# Patient Record
Sex: Male | Born: 1950 | Race: White | Hispanic: No | Marital: Single | Smoking: Current every day smoker
Health system: Southern US, Community
[De-identification: ages and names within clinical notes are randomized; demographics above are authoritative.]

## PROBLEM LIST (undated history)

## (undated) DIAGNOSIS — I96 Gangrene, not elsewhere classified: Secondary | ICD-10-CM

## (undated) DIAGNOSIS — S2249XA Multiple fractures of ribs, unspecified side, initial encounter for closed fracture: Secondary | ICD-10-CM

## (undated) DIAGNOSIS — I509 Heart failure, unspecified: Secondary | ICD-10-CM

## (undated) DIAGNOSIS — J40 Bronchitis, not specified as acute or chronic: Secondary | ICD-10-CM

## (undated) DIAGNOSIS — I739 Peripheral vascular disease, unspecified: Secondary | ICD-10-CM

## (undated) DIAGNOSIS — S329XXA Fracture of unspecified parts of lumbosacral spine and pelvis, initial encounter for closed fracture: Secondary | ICD-10-CM

## (undated) DIAGNOSIS — F419 Anxiety disorder, unspecified: Secondary | ICD-10-CM

## (undated) DIAGNOSIS — M549 Dorsalgia, unspecified: Secondary | ICD-10-CM

## (undated) DIAGNOSIS — J449 Chronic obstructive pulmonary disease, unspecified: Secondary | ICD-10-CM

## (undated) DIAGNOSIS — S2239XA Fracture of one rib, unspecified side, initial encounter for closed fracture: Secondary | ICD-10-CM

## (undated) DIAGNOSIS — M1990 Unspecified osteoarthritis, unspecified site: Secondary | ICD-10-CM

## (undated) DIAGNOSIS — F410 Panic disorder [episodic paroxysmal anxiety] without agoraphobia: Secondary | ICD-10-CM

## (undated) DIAGNOSIS — Z0000 Encounter for general adult medical examination without abnormal findings: Secondary | ICD-10-CM

## (undated) DIAGNOSIS — Z765 Malingerer [conscious simulation]: Secondary | ICD-10-CM

## (undated) HISTORY — DX: Encounter for general adult medical examination without abnormal findings: Z00.00

## (undated) HISTORY — DX: Chronic obstructive pulmonary disease, unspecified: J44.9

## (undated) HISTORY — DX: Heart failure, unspecified: I50.9

## (undated) HISTORY — DX: Gangrene, not elsewhere classified: I96

## (undated) HISTORY — DX: Peripheral vascular disease, unspecified: I73.9

## (undated) HISTORY — PX: TONSILLECTOMY: SUR1361

## (undated) HISTORY — DX: Bronchitis, not specified as acute or chronic: J40

## (undated) HISTORY — DX: Unspecified osteoarthritis, unspecified site: M19.90

## (undated) HISTORY — DX: Panic disorder (episodic paroxysmal anxiety): F41.0

## (undated) HISTORY — PX: HIP SURGERY: SHX245

---

## 2004-05-03 ENCOUNTER — Ambulatory Visit: Payer: Self-pay | Admitting: Internal Medicine

## 2004-05-17 ENCOUNTER — Ambulatory Visit: Payer: Self-pay | Admitting: Family Medicine

## 2004-05-18 ENCOUNTER — Ambulatory Visit: Payer: Self-pay | Admitting: *Deleted

## 2005-08-29 ENCOUNTER — Ambulatory Visit: Payer: Self-pay | Admitting: Oncology

## 2005-09-07 LAB — CBC WITH DIFFERENTIAL/PLATELET
Basophils Absolute: 0 10*3/uL (ref 0.0–0.1)
MCH: 34.4 pg — ABNORMAL HIGH (ref 28.0–33.4)
MCHC: 35 g/dL (ref 32.0–35.9)
MCV: 98.4 fL — ABNORMAL HIGH (ref 81.6–98.0)
MONO#: 0.6 10*3/uL (ref 0.1–0.9)
Platelets: 298 10*3/uL (ref 145–400)
RBC: 4.69 10*6/uL (ref 4.20–5.71)
RDW: 13.9 % (ref 11.2–14.6)
lymph#: 2.7 10*3/uL (ref 0.9–3.3)

## 2005-09-07 LAB — IVY BLEEDING TIME: Bleeding Time: 2.5 Minutes (ref 2.0–8.0)

## 2005-09-11 LAB — APTT: aPTT: 28 seconds (ref 24–37)

## 2005-09-11 LAB — COMPREHENSIVE METABOLIC PANEL
BUN: 8 mg/dL (ref 6–23)
CO2: 28 mEq/L (ref 19–32)
Calcium: 9.2 mg/dL (ref 8.4–10.5)
Glucose, Bld: 91 mg/dL (ref 70–99)
Potassium: 4.5 mEq/L (ref 3.5–5.3)
Sodium: 136 mEq/L (ref 135–145)

## 2005-09-11 LAB — FIBRINOGEN: Fibrinogen: 348 mg/dL (ref 204–475)

## 2005-09-11 LAB — PROTHROMBIN TIME: INR: 1 (ref 0.0–1.5)

## 2010-01-17 ENCOUNTER — Emergency Department (HOSPITAL_COMMUNITY)
Admission: EM | Admit: 2010-01-17 | Discharge: 2010-01-17 | Payer: Self-pay | Source: Home / Self Care | Admitting: Emergency Medicine

## 2011-02-12 ENCOUNTER — Emergency Department (HOSPITAL_COMMUNITY)
Admission: EM | Admit: 2011-02-12 | Discharge: 2011-02-13 | Disposition: A | Discharge status: Home / Self Care | Payer: Medicaid Other | Attending: Emergency Medicine | Admitting: Emergency Medicine

## 2011-02-12 ENCOUNTER — Emergency Department (HOSPITAL_COMMUNITY): Payer: Medicaid Other

## 2011-02-12 DIAGNOSIS — S99929A Unspecified injury of unspecified foot, initial encounter: Secondary | ICD-10-CM

## 2011-02-12 DIAGNOSIS — IMO0002 Reserved for concepts with insufficient information to code with codable children: Secondary | ICD-10-CM | POA: Insufficient documentation

## 2011-02-12 DIAGNOSIS — Y92009 Unspecified place in unspecified non-institutional (private) residence as the place of occurrence of the external cause: Secondary | ICD-10-CM | POA: Insufficient documentation

## 2011-02-12 DIAGNOSIS — M79609 Pain in unspecified limb: Secondary | ICD-10-CM | POA: Insufficient documentation

## 2011-02-12 DIAGNOSIS — S8990XA Unspecified injury of unspecified lower leg, initial encounter: Secondary | ICD-10-CM | POA: Insufficient documentation

## 2011-02-12 HISTORY — DX: Anxiety disorder, unspecified: F41.9

## 2011-02-12 NOTE — ED Notes (Signed)
Pt reports injuring left foot three weeks ago - reports increased pain- swelling and bruising to left foot

## 2011-02-13 MED ORDER — OXYCODONE-ACETAMINOPHEN 5-325 MG PO TABS: 1.0000 | ORAL_TABLET | ORAL | Status: AC | PRN

## 2011-02-13 NOTE — ED Provider Notes (Signed)
History     CSN: [id number]  Arrival date & time 02/12/11  2041   First MD Initiated Contact with Patient 02/13/11 0001      Chief Complaint  Patient presents with  . Foot Pain    Patient is a 61 y.o. male presenting with lower extremity pain. The history is provided by the patient.  Foot Pain This is a new problem. The current episode started more than 1 week ago. The problem occurs constantly. The problem has been gradually worsening. Pertinent negatives include no headaches. The symptoms are aggravated by walking. The symptoms are relieved by rest.  Pt reports that three weeks ago he accidentally kicked his door with both feet The pain has worsened in his left foot He denies burn exposure He denies cold/wet exposure to his foot He denies weakness/numbness to his feet  Past Medical History  Diagnosis Date  . Anxiety     History reviewed. No pertinent past surgical history.  No family history on file.  History  Substance Use Topics  . Smoking status: Current Everyday Smoker  . Smokeless tobacco: Not on file  . Alcohol Use: Yes      Review of Systems  Constitutional: Negative for fever.  Neurological: Negative for headaches.    Allergies  Penicillins  Home Medications   Current Outpatient Rx  Name Route Sig Dispense Refill  . ALPRAZOLAM 1 MG PO TABS Oral Take 1 mg by mouth at bedtime as needed.      . TRAZODONE HCL 100 MG PO TABS Oral Take 100 mg by mouth at bedtime.        BP 145/103  Pulse 77  Temp(Src) 98.7 F (37.1 C) (Oral)  Resp 18  Wt 159 lb 12.8 oz (72.485 kg)  SpO2 99%  Physical Exam  CONSTITUTIONAL: Well developed/well nourished HEAD AND FACE: Normocephalic/atraumatic EYES: EOMI/PERRL ENMT: Mucous membranes moist NECK: supple no meningeal signs SPINE:entire spine nontender CV: S1/S2 noted, no murmurs/rubs/gallops noted LUNGS: Lungs are clear to auscultation bilaterally, no apparent distress ABDOMEN: soft, nontender, no rebound or  guarding NEURO: Pt is awake/alert, moves all extremitiesx4 There is no motor deficit in his lower extremities, no sensory loss in his feet EXTREMITIES: full ROM, no deformity, full ROM of both ankles/toes SKIN: on left foot - great toe, 2nd toe and 5th toe - there is apparent blisters that have started to heal.  No drainage/erythema.  The left foot is not cold to touch.  No other wounds noted to feet on in webspaces.  No lacerations.   PSYCH: no abnormalities of mood noted   ED Course  Procedures   Labs Reviewed - No data to display Dg Foot Complete Left  02/12/2011  *RADIOLOGY REPORT*  Clinical Data: Left foot pain after the patient kicked a door.  LEFT FOOT - COMPLETE 3+ VIEW  Comparison: None.  Findings: There is no acute fracture or dislocation.  Prominent dorsal spurring at the talonavicular joint.  Minimal arthritic changes of the first metatarsal phalangeal joint.  IMPRESSION: No acute abnormalities.  Original Report Authenticated By: Gwynn Burly, M.D.    12:29 AM Pt reports merely kicked door accidentally, but appears to have some maceration and blisters to the foot Will need postop boot and podiatry followup.  He has no signs of neurovascular compromise, and no fever and no signs of infection Pulses noted by bedside doppler  MDM  Nursing notes reviewed and considered in documentation xrays reviewed and considered  Joya Gaskins, MD 02/13/11 [phone]

## 2011-02-23 ENCOUNTER — Emergency Department (HOSPITAL_COMMUNITY): Payer: Medicaid Other

## 2011-02-23 ENCOUNTER — Emergency Department (HOSPITAL_COMMUNITY)
Admission: EM | Admit: 2011-02-23 | Discharge: 2011-02-23 | Disposition: A | Discharge status: Home / Self Care | Payer: Medicaid Other | Attending: Emergency Medicine | Admitting: Emergency Medicine

## 2011-02-23 ENCOUNTER — Encounter (HOSPITAL_COMMUNITY): Payer: Self-pay | Admitting: *Deleted

## 2011-02-23 ENCOUNTER — Emergency Department (HOSPITAL_COMMUNITY)
Admission: EM | Admit: 2011-02-23 | Discharge: 2011-02-23 | Disposition: A | Discharge status: Nursing Home (Includes ICF) | Payer: Medicaid Other | Source: Home / Self Care | Attending: Emergency Medicine | Admitting: Emergency Medicine

## 2011-02-23 ENCOUNTER — Encounter (HOSPITAL_COMMUNITY): Payer: Self-pay | Admitting: Emergency Medicine

## 2011-02-23 DIAGNOSIS — M25579 Pain in unspecified ankle and joints of unspecified foot: Secondary | ICD-10-CM | POA: Insufficient documentation

## 2011-02-23 DIAGNOSIS — M7989 Other specified soft tissue disorders: Secondary | ICD-10-CM | POA: Insufficient documentation

## 2011-02-23 DIAGNOSIS — S90129A Contusion of unspecified lesser toe(s) without damage to nail, initial encounter: Secondary | ICD-10-CM

## 2011-02-23 DIAGNOSIS — W2209XA Striking against other stationary object, initial encounter: Secondary | ICD-10-CM | POA: Insufficient documentation

## 2011-02-23 DIAGNOSIS — R609 Edema, unspecified: Secondary | ICD-10-CM | POA: Insufficient documentation

## 2011-02-23 DIAGNOSIS — M79609 Pain in unspecified limb: Secondary | ICD-10-CM | POA: Insufficient documentation

## 2011-02-23 DIAGNOSIS — I96 Gangrene, not elsewhere classified: Secondary | ICD-10-CM | POA: Insufficient documentation

## 2011-02-23 DIAGNOSIS — R062 Wheezing: Secondary | ICD-10-CM | POA: Insufficient documentation

## 2011-02-23 DIAGNOSIS — L089 Local infection of the skin and subcutaneous tissue, unspecified: Secondary | ICD-10-CM

## 2011-02-23 HISTORY — DX: Multiple fractures of ribs, unspecified side, initial encounter for closed fracture: S22.49XA

## 2011-02-23 HISTORY — DX: Fracture of one rib, unspecified side, initial encounter for closed fracture: S22.39XA

## 2011-02-23 HISTORY — DX: Dorsalgia, unspecified: M54.9

## 2011-02-23 HISTORY — DX: Fracture of unspecified parts of lumbosacral spine and pelvis, initial encounter for closed fracture: S32.9XXA

## 2011-02-23 LAB — COMPREHENSIVE METABOLIC PANEL
ALT: 16 U/L (ref 0–53)
BUN: 3 mg/dL — ABNORMAL LOW (ref 6–23)
CO2: 29 mEq/L (ref 19–32)
Calcium: 9.5 mg/dL (ref 8.4–10.5)
GFR calc Af Amer: >90 mL/min (ref >90)
Potassium: 4.7 mEq/L (ref 3.5–5.1)
Total Protein: 7 g/dL (ref 6.0–8.3)

## 2011-02-23 LAB — DIFFERENTIAL
Basophils Absolute: 0 10*3/uL (ref 0.0–0.1)
Basophils Relative: 0 % (ref 0–1)
Eosinophils Absolute: 0.1 10*3/uL (ref 0.0–0.7)
Eosinophils Relative: 1 % (ref 0–5)
Lymphocytes Relative: 45 % (ref 12–46)
Lymphs Abs: 4.5 10*3/uL — ABNORMAL HIGH (ref 0.7–4.0)
Monocytes Absolute: 0.8 10*3/uL (ref 0.1–1.0)
Monocytes Relative: 9 % (ref 3–12)
Neutro Abs: 4.5 10*3/uL (ref 1.7–7.7)
Neutrophils Relative %: 45 % (ref 43–77)

## 2011-02-23 LAB — CBC
HCT: 41.8 % (ref 39.0–52.0)
MCHC: 34.9 g/dL (ref 30.0–36.0)
Platelets: 323 10*3/uL (ref 150–400)
WBC: 9.9 10*3/uL (ref 4.0–10.5)

## 2011-02-23 LAB — LACTIC ACID, PLASMA: Lactic Acid, Venous: 1.6 mmol/L (ref 0.5–2.2)

## 2011-02-23 LAB — CULTURE, BLOOD (ROUTINE X 2)
Culture  Setup Time: 201301112359
Culture  Setup Time: 201301112359
Culture: NO GROWTH

## 2011-02-23 MED ORDER — HYDROCODONE-ACETAMINOPHEN 5-325 MG PO TABS: 1.0000 | ORAL_TABLET | ORAL | Status: AC | PRN

## 2011-02-23 MED ORDER — VANCOMYCIN HCL IN DEXTROSE 1-5 GM/200ML-% IV SOLN
1000.0000 mg | Freq: Once | INTRAVENOUS | Status: AC
  Administered 2011-02-23: 1000 mg via INTRAVENOUS
  Filled 2011-02-23: qty 200

## 2011-02-23 MED ORDER — SODIUM CHLORIDE 0.9 % IV BOLUS (SEPSIS)
1000.0000 mL | Freq: Once | INTRAVENOUS | Status: AC
  Administered 2011-02-23: 1000 mL via INTRAVENOUS

## 2011-02-23 NOTE — ED Notes (Signed)
Received patient from stretcher triage. Pt noted alert and oriented and in no acute distress. Vancomycin infusion started.
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
## 2011-02-23 NOTE — ED Notes (Signed)
PT RETURNS TODAY WITH LEFT FOOT PAIN STARTED IN X 3 TOES RADIATING UP TO L CALF CONSTANT,SHARP SHOOTING PAIN S/P FOOT INJURY 02/12/11.PT WAS SEEN @ [place] FOR INJURY.XRAY NEG FOR DISLOCATION OR FX.PT PRESCRIBED PERCOCET # 15 TABS BUT STATES THERE NOT WORKING.ON EXAM X3 DIGITS PURPLE/BLUE + SENSATION AND LITTLE MOVEMENT.PT HAS POST OP SHOE ON.

## 2011-02-23 NOTE — ED Notes (Signed)
Patient transported to x-ray at this time.   

## 2011-02-23 NOTE — ED Notes (Signed)
NP updating patient on plan of care.

## 2011-02-23 NOTE — ED Notes (Signed)
Antibiotic infusion complete, iv site flushed and benign.  Pt noted in no acute distress.

## 2011-02-23 NOTE — ED Notes (Signed)
Ordered Regular diet tray

## 2011-02-23 NOTE — ED Notes (Signed)
Orthopedic at bedside

## 2011-02-23 NOTE — ED Notes (Signed)
The pt has had a bad foot for awhile.  He has had pain and infection for the past week.  He was sent here from [facility].  He has been seen at [facility] long ed for the same.. The ;t toes are draining
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
## 2011-03-20 ENCOUNTER — Encounter (HOSPITAL_COMMUNITY): Payer: Self-pay | Admitting: Emergency Medicine

## 2011-03-20 ENCOUNTER — Emergency Department (HOSPITAL_COMMUNITY)
Admission: EM | Admit: 2011-03-20 | Discharge: 2011-03-20 | Disposition: A | Discharge status: Nursing Home (Includes ICF) | Payer: Medicaid Other | Source: Home / Self Care | Attending: Family Medicine | Admitting: Family Medicine

## 2011-03-20 ENCOUNTER — Encounter (HOSPITAL_COMMUNITY): Payer: Self-pay | Admitting: *Deleted

## 2011-03-20 ENCOUNTER — Emergency Department (HOSPITAL_COMMUNITY)
Admission: EM | Admit: 2011-03-20 | Discharge: 2011-03-21 | Disposition: A | Discharge status: Home / Self Care | Payer: Medicaid Other | Attending: Emergency Medicine | Admitting: Emergency Medicine

## 2011-03-20 DIAGNOSIS — IMO0002 Reserved for concepts with insufficient information to code with codable children: Secondary | ICD-10-CM | POA: Insufficient documentation

## 2011-03-20 DIAGNOSIS — R609 Edema, unspecified: Secondary | ICD-10-CM | POA: Insufficient documentation

## 2011-03-20 DIAGNOSIS — I96 Gangrene, not elsewhere classified: Secondary | ICD-10-CM

## 2011-03-20 DIAGNOSIS — M79609 Pain in unspecified limb: Secondary | ICD-10-CM | POA: Insufficient documentation

## 2011-03-20 DIAGNOSIS — M7989 Other specified soft tissue disorders: Secondary | ICD-10-CM | POA: Insufficient documentation

## 2011-03-20 DIAGNOSIS — M25579 Pain in unspecified ankle and joints of unspecified foot: Secondary | ICD-10-CM | POA: Insufficient documentation

## 2011-03-20 DIAGNOSIS — S99929A Unspecified injury of unspecified foot, initial encounter: Secondary | ICD-10-CM | POA: Insufficient documentation

## 2011-03-20 DIAGNOSIS — S8990XA Unspecified injury of unspecified lower leg, initial encounter: Secondary | ICD-10-CM | POA: Insufficient documentation

## 2011-03-20 LAB — CBC
HCT: 43.4 % (ref 39.0–52.0)
MCH: 34.9 pg — ABNORMAL HIGH (ref 26.0–34.0)
MCHC: 35 g/dL (ref 30.0–36.0)
RDW: 13 % (ref 11.5–15.5)

## 2011-03-20 LAB — BASIC METABOLIC PANEL
BUN: 4 mg/dL — ABNORMAL LOW (ref 6–23)
Calcium: 9.9 mg/dL (ref 8.4–10.5)
Chloride: 98 mEq/L (ref 96–112)
Creatinine, Ser: 0.83 mg/dL (ref 0.50–1.35)
GFR calc Af Amer: >90 mL/min (ref >90)
GFR calc non Af Amer: >90 mL/min (ref >90)

## 2011-03-20 LAB — DIFFERENTIAL
Basophils Absolute: 0.1 10*3/uL (ref 0.0–0.1)
Basophils Relative: 1 % (ref 0–1)
Eosinophils Absolute: 0.1 10*3/uL (ref 0.0–0.7)
Monocytes Absolute: 0.6 10*3/uL (ref 0.1–1.0)
Neutro Abs: 4.4 10*3/uL (ref 1.7–7.7)

## 2011-03-20 MED ORDER — ALPRAZOLAM 0.5 MG PO TABS
0.5000 mg | ORAL_TABLET | Freq: Once | ORAL | Status: AC
  Administered 2011-03-20: 0.5 mg via ORAL
  Filled 2011-03-20: qty 1

## 2011-03-20 MED ORDER — OXYCODONE-ACETAMINOPHEN 5-325 MG PO TABS
1.0000 | ORAL_TABLET | Freq: Once | ORAL | Status: AC
  Administered 2011-03-20: 1 via ORAL
  Filled 2011-03-20: qty 1

## 2011-03-20 MED ORDER — HYDROCODONE-ACETAMINOPHEN 5-325 MG PO TABS: 1.0000 | ORAL_TABLET | ORAL | Status: AC | PRN
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
## 2011-03-20 NOTE — ED Notes (Signed)
PT. TRANSFERRED FROM [facility], PRESENTS WITH INFECTED LEFT 2ND TOE WITH GANGRENE / DISCHARGE FOR 7 WEEKS , RECEIVED ANTIBIOTICS WITH NO IMPROVEMENT. , DENIES FEVER OR CHILLS.

## 2011-03-20 NOTE — ED Notes (Addendum)
pT  WAS  SEEN  02/23/2011 [facility]  AND  WAS  TRANSFERRED  TO  ED       HE  REPORTS  PAIN L  FOOT       L  2ND  TOE    THE  TOE  IS  BLACK  AND  HAS  DRAINAGE  HE  HAS  A  WOODEN SHOE  IN PLACE    -  HE  WDID  NOT  FOLLOWUP  WITH  A  PHYSICIAN       DUE  TO  FINANCES  -  HE  IS  HERE  TODAY ASKING  FOR ANTI  BIOTICS  AN D  PAIN PILLS
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
## 2011-03-21 NOTE — ED Provider Notes (Signed)
Medical screening examination/treatment/procedure(s) were conducted as a shared visit with non-physician practitioner(s) and myself.  I personally evaluated the patient during the encounter  Hurman Horn, MD 03/21/11 2153

## 2011-03-21 NOTE — ED Notes (Signed)
Rx x 1, pt voiced understanding to f/u with given referral.  Ambulatory on discharge.

## 2011-04-10 ENCOUNTER — Encounter (HOSPITAL_BASED_OUTPATIENT_CLINIC_OR_DEPARTMENT_OTHER): Payer: Medicaid Other | Attending: General Surgery

## 2011-04-10 DIAGNOSIS — F410 Panic disorder [episodic paroxysmal anxiety] without agoraphobia: Secondary | ICD-10-CM | POA: Insufficient documentation

## 2011-04-10 DIAGNOSIS — F17200 Nicotine dependence, unspecified, uncomplicated: Secondary | ICD-10-CM | POA: Insufficient documentation

## 2011-04-10 DIAGNOSIS — R238 Other skin changes: Secondary | ICD-10-CM | POA: Insufficient documentation

## 2011-04-10 DIAGNOSIS — Z79899 Other long term (current) drug therapy: Secondary | ICD-10-CM | POA: Insufficient documentation

## 2011-04-10 DIAGNOSIS — M79609 Pain in unspecified limb: Secondary | ICD-10-CM | POA: Insufficient documentation
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
## 2011-04-11 ENCOUNTER — Encounter: Payer: Self-pay | Admitting: Vascular Surgery

## 2011-04-11 ENCOUNTER — Encounter: Payer: Medicaid Other | Admitting: Vascular Surgery

## 2011-04-11 ENCOUNTER — Telehealth: Payer: Self-pay | Admitting: Vascular Surgery

## 2011-04-11 NOTE — Telephone Encounter (Signed)
Lamar Blinks, RN here @ [facility] had asked that I add this patient on to [facility] schedule today 04/11/11 for impending gangrene. I added patient to 3:15pm appointment and lvm for pt to call back and confirm. Pt called back and stated that he could not make it in today. I called Onalee Hua @ [facility] to inform of pts refusal of appt. Onalee Hua spoke with the triage nurse @ his office. She said that the patient had already been to the ER for evaluation and if he is refusing todays appt, just schedule for next available. Will route this to [employer].

## 2011-04-13 ENCOUNTER — Encounter: Payer: Self-pay | Admitting: Surgery

## 2011-04-16 ENCOUNTER — Ambulatory Visit (INDEPENDENT_AMBULATORY_CARE_PROVIDER_SITE_OTHER): Payer: Medicaid Other | Admitting: Surgery

## 2011-04-16 ENCOUNTER — Encounter: Payer: Self-pay | Admitting: Surgery

## 2011-04-16 VITALS — BP 141/75 | HR 83 | Resp 16 | Ht 72.0 in | Wt 158.0 lb

## 2011-04-16 DIAGNOSIS — I70269 Atherosclerosis of native arteries of extremities with gangrene, unspecified extremity: Secondary | ICD-10-CM

## 2011-04-16 DIAGNOSIS — I96 Gangrene, not elsewhere classified: Secondary | ICD-10-CM | POA: Insufficient documentation

## 2011-04-16 NOTE — Progress Notes (Signed)
Addended by: Sharee Pimple on: 04/16/2011 03:40 PM   Modules accepted: Orders
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
## 2011-04-27 ENCOUNTER — Encounter: Payer: Self-pay | Admitting: Surgery

## 2011-04-30 ENCOUNTER — Ambulatory Visit (INDEPENDENT_AMBULATORY_CARE_PROVIDER_SITE_OTHER): Payer: Medicaid Other | Admitting: Surgery

## 2011-04-30 ENCOUNTER — Encounter: Payer: Self-pay | Admitting: Surgery

## 2011-04-30 ENCOUNTER — Ambulatory Visit: Payer: Medicaid Other | Admitting: Surgery

## 2011-04-30 ENCOUNTER — Ambulatory Visit (INDEPENDENT_AMBULATORY_CARE_PROVIDER_SITE_OTHER): Payer: Medicaid Other | Admitting: Vascular Surgery

## 2011-04-30 VITALS — BP 150/85 | HR 70 | Resp 16 | Ht 72.0 in | Wt 159.0 lb

## 2011-04-30 DIAGNOSIS — I739 Peripheral vascular disease, unspecified: Secondary | ICD-10-CM

## 2011-04-30 DIAGNOSIS — I70269 Atherosclerosis of native arteries of extremities with gangrene, unspecified extremity: Secondary | ICD-10-CM

## 2011-04-30 DIAGNOSIS — I96 Gangrene, not elsewhere classified: Secondary | ICD-10-CM

## 2011-04-30 NOTE — Progress Notes (Signed)
ABI performed @ [facility] 04/30/2011
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
## 2011-05-08 ENCOUNTER — Encounter (HOSPITAL_BASED_OUTPATIENT_CLINIC_OR_DEPARTMENT_OTHER): Payer: Medicaid Other

## 2011-05-14 ENCOUNTER — Telehealth: Payer: Self-pay | Admitting: *Deleted

## 2011-05-14 NOTE — Telephone Encounter (Signed)
Pt called requesting more pain meds, he has taken all #40 oxycodone that [facility] rx'd on 04-30-11. I told him that we could work him in with Kallie Edward, NP or go ahead with scheduling aortogram as per 04-30-11 note. I told him that I could not guarantee that Rusty would Rx any narcotics for him. He said he wanted to go ahead and schedule aortogram, so I gave his cell# to [place] for scheduling.

## 2011-05-14 NOTE — Telephone Encounter (Signed)
Mr Hy called again, wanting pain meds, I told him that Dr. Myra Gianotti was not in the office today. If his pain was unbearable, he could go to the [facility] ED for evaluation today. He said he had been to several ED's since the 1st of the year and they didn't help him. I told him that if he went, they would evaluate him today and if needed, they would call our MD on call for assistance. He voiced understanding and said he was trying to get into a Pain Clinic for help. I told him this was a good idea and also that our schedulers would be calling him back to set up aortogram for bloodflow evaluation.

## 2011-05-18 ENCOUNTER — Telehealth: Payer: Self-pay | Admitting: *Deleted

## 2011-05-18 NOTE — Telephone Encounter (Signed)
Joe Ballard returned my call from 05/14/11. Very slurry speech,very upset because did not get oxycodone called in the other day. Kept asking for " Dr Matilde Haymaker" number because he wasn't suppose to let him hurt. Refused to set up angiogram. Continuously asked for pain medicine "oxycodone". He said we were not taking his toe off. I explained 3 times that we wanted to evaluate the blood flow with the angiogram and help the blood flow if we could to prevent loss of limb.He did not seem to understand anything except he needed pain medicine. I offered to do the study this next Tues or Wed. He hung up on me.The conversation was 20 minutes of aggressive beratement

## 2011-05-21 ENCOUNTER — Telehealth: Payer: Self-pay

## 2011-05-21 ENCOUNTER — Other Ambulatory Visit: Payer: Self-pay | Admitting: *Deleted

## 2011-05-21 ENCOUNTER — Encounter (HOSPITAL_COMMUNITY): Payer: Self-pay | Admitting: Pharmacy Technician

## 2011-05-21 DIAGNOSIS — L98499 Non-pressure chronic ulcer of skin of other sites with unspecified severity: Secondary | ICD-10-CM

## 2011-05-21 MED ORDER — OXYCODONE HCL 5 MG PO TABS: ORAL_TABLET | ORAL | Status: DC

## 2011-05-21 NOTE — Telephone Encounter (Signed)
Phone call from pt. States he wants to schedule angiogram and asking for pain medication refill.  C/o pain worsening in 2nd toe left foot.   States the sore looks a little better, but the pain is very bad.  Can't sleep at night.  Discussed w/ Dr. Myra Gianotti.  Advised to schedule Aortogram with runoff, and possible intervention on Wed. 4/10, and rec'd v.o. For Oxycodone 5 mg 1-2 tabs q 4-6 hrs/ prn.  Advised pt. of plan to do procedure on 4/10, and instructions given for procedure.  Pt. instructed to come to office to pick up prescription.   Verb. Understanding.

## 2011-05-22 ENCOUNTER — Other Ambulatory Visit: Payer: Self-pay | Admitting: *Deleted

## 2011-05-23 ENCOUNTER — Encounter (HOSPITAL_COMMUNITY): Payer: Self-pay | Admitting: General Practice

## 2011-05-23 ENCOUNTER — Encounter (HOSPITAL_COMMUNITY)
Admission: RE | Disposition: A | Discharge status: Home / Self Care | Payer: Self-pay | Source: Ambulatory Visit | Attending: Surgery

## 2011-05-23 ENCOUNTER — Observation Stay (HOSPITAL_COMMUNITY)
Admission: RE | Admit: 2011-05-23 | Discharge: 2011-05-24 | Disposition: A | Discharge status: Home / Self Care | Payer: Medicaid Other | Source: Ambulatory Visit | Attending: Surgery | Admitting: Surgery

## 2011-05-23 DIAGNOSIS — I70269 Atherosclerosis of native arteries of extremities with gangrene, unspecified extremity: Secondary | ICD-10-CM

## 2011-05-23 DIAGNOSIS — I70209 Unspecified atherosclerosis of native arteries of extremities, unspecified extremity: Principal | ICD-10-CM | POA: Insufficient documentation

## 2011-05-23 DIAGNOSIS — F17200 Nicotine dependence, unspecified, uncomplicated: Secondary | ICD-10-CM | POA: Insufficient documentation

## 2011-05-23 DIAGNOSIS — Z5900 Homelessness unspecified: Secondary | ICD-10-CM | POA: Insufficient documentation

## 2011-05-23 DIAGNOSIS — I739 Peripheral vascular disease, unspecified: Secondary | ICD-10-CM

## 2011-05-23 HISTORY — PX: ABDOMINAL AORTAGRAM: SHX5454

## 2011-05-23 HISTORY — PX: OTHER SURGICAL HISTORY: SHX169

## 2011-05-23 LAB — POCT I-STAT, CHEM 8
Calcium, Ion: 1 mmol/L — ABNORMAL LOW (ref 1.12–1.32)
HCT: 44 % (ref 39.0–52.0)
Hemoglobin: 15 g/dL (ref 13.0–17.0)
TCO2: 23 mmol/L (ref 0–100)

## 2011-05-23 LAB — POCT ACTIVATED CLOTTING TIME: Activated Clotting Time: 276 seconds

## 2011-05-23 SURGERY — ABDOMINAL AORTAGRAM
Anesthesia: LOCAL

## 2011-05-23 MED ORDER — TRAZODONE HCL 50 MG PO TABS
350.0000 mg | ORAL_TABLET | Freq: Every day | ORAL | Status: DC
  Administered 2011-05-23: 350 mg via ORAL
  Filled 2011-05-23 (×2): qty 1

## 2011-05-23 MED ORDER — HEPARIN SODIUM (PORCINE) 1000 UNIT/ML IJ SOLN
INTRAMUSCULAR | Status: AC
  Filled 2011-05-23: qty 1

## 2011-05-23 MED ORDER — GUAIFENESIN-DM 100-10 MG/5ML PO SYRP: 15.0000 mL | ORAL_SOLUTION | ORAL | Status: DC | PRN

## 2011-05-23 MED ORDER — PHENOL 1.4 % MT LIQD
1.0000 | OROMUCOSAL | Status: DC | PRN
  Filled 2011-05-23: qty 177

## 2011-05-23 MED ORDER — ACETAMINOPHEN 650 MG RE SUPP: 325.0000 mg | RECTAL | Status: DC | PRN

## 2011-05-23 MED ORDER — SODIUM CHLORIDE 0.9 % IV SOLN: INTRAVENOUS | Status: DC

## 2011-05-23 MED ORDER — FENTANYL CITRATE 0.05 MG/ML IJ SOLN
INTRAMUSCULAR | Status: AC
  Filled 2011-05-23: qty 2

## 2011-05-23 MED ORDER — LIDOCAINE HCL (PF) 1 % IJ SOLN
INTRAMUSCULAR | Status: AC
  Filled 2011-05-23: qty 30

## 2011-05-23 MED ORDER — ALPRAZOLAM 0.5 MG PO TABS
1.0000 mg | ORAL_TABLET | Freq: Four times a day (QID) | ORAL | Status: DC
  Administered 2011-05-23 (×3): 1 mg via ORAL
  Filled 2011-05-23 (×3): qty 2

## 2011-05-23 MED ORDER — SODIUM CHLORIDE 0.9 % IV SOLN
INTRAVENOUS | Status: DC
  Administered 2011-05-23: 1000 mL via INTRAVENOUS

## 2011-05-23 MED ORDER — MIDAZOLAM HCL 2 MG/2ML IJ SOLN
INTRAMUSCULAR | Status: AC
  Filled 2011-05-23: qty 2

## 2011-05-23 MED ORDER — ONDANSETRON HCL 4 MG/2ML IJ SOLN: 4.0000 mg | Freq: Four times a day (QID) | INTRAMUSCULAR | Status: DC | PRN

## 2011-05-23 MED ORDER — ACETAMINOPHEN 325 MG PO TABS: 325.0000 mg | ORAL_TABLET | ORAL | Status: DC | PRN

## 2011-05-23 MED ORDER — HEPARIN (PORCINE) IN NACL 2-0.9 UNIT/ML-% IJ SOLN
INTRAMUSCULAR | Status: AC
  Filled 2011-05-23: qty 1000

## 2011-05-23 MED ORDER — CLONIDINE HCL 0.2 MG PO TABS
0.2000 mg | ORAL_TABLET | ORAL | Status: DC | PRN
  Filled 2011-05-23: qty 1

## 2011-05-23 MED ORDER — OXYCODONE-ACETAMINOPHEN 5-325 MG PO TABS
1.0000 | ORAL_TABLET | ORAL | Status: DC | PRN
  Administered 2011-05-23 – 2011-05-24 (×4): 2 via ORAL
  Filled 2011-05-23 (×4): qty 2

## 2011-05-23 MED ORDER — METOPROLOL TARTRATE 1 MG/ML IV SOLN: 2.0000 mg | INTRAVENOUS | Status: DC | PRN

## 2011-05-23 MED ORDER — HYDROMORPHONE HCL PF 1 MG/ML IJ SOLN: 0.5000 mg | INTRAMUSCULAR | Status: DC | PRN

## 2011-05-23 MED ORDER — ALBUTEROL SULFATE HFA 108 (90 BASE) MCG/ACT IN AERS
2.0000 | INHALATION_SPRAY | Freq: Four times a day (QID) | RESPIRATORY_TRACT | Status: DC | PRN
  Filled 2011-05-23: qty 6.7

## 2011-05-23 NOTE — Interval H&P Note (Signed)
History and Physical Interval Note:  05/23/2011 8:53 AM  Joe Ballard  has presented today for surgery, with the diagnosis of PVD/ With gangreen  The various methods of treatment have been discussed with the patient and family. After consideration of risks, benefits and other options for treatment, the patient has consented to  Procedure(s) (LRB): ABDOMINAL AORTAGRAM (N/A) as a surgical intervention .  The patients' history has been reviewed, patient examined, no change in status, stable for surgery.  I have reviewed the patients' chart and labs.  Questions were answered to the patient's satisfaction.     Joe Ballard IV, V. WELLS
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
## 2011-05-23 NOTE — Consult Note (Signed)
Pt smokes 1 1/2 ppd plus. He says he has tried patches in the past and knows how to quit on them but says they don't work. Discussed the fact that he needs 2 patches to help him quit. Does not want instructions on how to use 2 patches to quit. He stops answering questions and talking. Pt is in pre-contemplation stage.

## 2011-05-23 NOTE — Op Note (Signed)
[facility] [place]  Patient name: Deniz Hannan MRN: [id number] DOB: Jul 04, 1950 Sex: male  05/23/2011 Pre-operative Diagnosis: Left second toe ischemia Post-operative diagnosis:  Same Surgeon:  Jorge Ny Procedure Performed:  1.  ultrasound access right femoral artery  2.  abdominal aortogram  3.  bilateral lower extremity runoff  4.  third order catheterization (superficial femoral artery)  5.  angioplasty left superficial femoral artery  6.  followup x1  7.  closure device (Proglide)   Indications:  The patient comes in today for further evaluation of his bilateral lower extremity. He has ABIs 0.8 on the left and 0.5 on the right. He has a second ulceration that is progressing towards gangrene. He is having significant pain issues.  Procedure:  The patient was identified in the holding area and taken to room 8.  The patient was then placed supine on the table and prepped and draped in the usual sterile fashion.  A time out was called.  Ultrasound was used to evaluate the right common femoral artery.  It was patent .  A digital ultrasound image was acquired.  The right common femoral artery was accessed under ultrasound guidance with an 18-gauge needle. A Benson wire was advanced without resistance and a 5 French sheath was placed.  An omniflush catheter was advanced over the wire to the level of L-1.  An abdominal angiogram was obtained.  Next, using the omniflush catheter and a benson wire, the aortic bifurcation was crossed and the catheter was placed into theleft external iliac artery and left runoff was obtained.  right runoff was performed via retrograde sheath injections.  Findings:   Aortogram:  The visualized portions of the suprarenal abdominal aorta showed no significant disease. There is a right accessory renal artery. The main right and left renal artery are widely patent. The infrarenal abdominal aorta is widely patent. Bilateral common external  and internal iliac arteries are widely patent.  Right Lower Extremity:  The right common femoral artery is widely patent. The right profunda femoral artery is widely patent. The right superficial femoral artery is occluded. There is a short nipple at the origin. There is reconstitution of the above-knee popliteal artery. The Popliteal artery is patent throughout it's course. There is three-vessel runoff to the right leg, the posterior tibial is the dominant vessel.  Left Lower Extremity:  The left common femoral artery is widely patent the left profunda femoris widely patent left superficial femoral artery is patent however within the adductor canal there is a 95% stenosis. This is very focal. The popliteal artery is patent throughout it's course there is two-vessel runoff to the ankle. The dominant vessel is the posterior tibial.  Intervention:  After the above images were obtained the decision was made to proceed with intervention. Over a 035 wire a 6 French 45 cm and so 1 sheath was advanced into the left external iliac artery. The patient was fully heparinized. Using a 035 Wholey wire and a 5 x 2 balloon as a support catheter I crossed the lesion. I elected to perform primary balloon angioplasty of this lesion. The balloon was taken to 9 atmospheres and held for 90 seconds. A followup study revealed improved results however there was a focal dissection at the area of angioplasty. I therefore elected to reinsert the balloon and repeat balloon angioplasty. I put the balloon to 4 atmospheres and held for 3 minutes. With this maneuver on completion studies the dissection had essentially resolved. Runoff to  the foot was unchanged. I elected to terminate the procedure with these results. The right groin was closed using a Proglide device.  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 Because of the patient's social issues (homeless) I elected to  perform primary balloon angioplasty of this short segment lesion. This was done with a 5 x 2 balloon. I initially had a dissection which resolved with a repeat inflation.  #2  occluded right superficial femoral artery with reconstitution of the popliteal artery above the knee     V. Durene Cal, M.D. [facility] [place] Office: [phone] Pager:  [phone]

## 2011-05-24 LAB — BASIC METABOLIC PANEL
BUN: 8 mg/dL (ref 6–23)
GFR calc Af Amer: >90 mL/min (ref >90)
GFR calc non Af Amer: >90 mL/min (ref >90)
Potassium: 4.2 mEq/L (ref 3.5–5.1)
Sodium: 128 mEq/L — ABNORMAL LOW (ref 135–145)

## 2011-05-24 LAB — CBC
MCHC: 34.6 g/dL (ref 30.0–36.0)
Platelets: 217 10*3/uL (ref 150–400)
RDW: 12.9 % (ref 11.5–15.5)

## 2011-05-24 MED ORDER — OXYCODONE HCL 5 MG PO TABS: 5.0000 mg | ORAL_TABLET | ORAL | Status: DC | PRN

## 2011-05-24 NOTE — Discharge Summary (Signed)
Agree with above Stable for discharge  [facility] Keymani Glynn

## 2011-05-24 NOTE — Progress Notes (Signed)
Patient education complete and patient discharge instructions given to pt. IV site d/c. Site WNL. No s/s of distress. Discharged home via wheelchair Chestine Spore, Bary Richard

## 2011-05-24 NOTE — Progress Notes (Addendum)
[facility] OF   Progress Note   Date of Surgery: 05/23/2011  Procedure(s): Angioplasty left SFA ABDOMINAL AORTAGRAM Surgeon: Surgeon(s): Nada Libman, MD  1 Day Post-Op  History of Present Illness  Joe Ballard is a 61 y.o. maleThe patient's pre-op symptoms of pain in left leg and toe are Improved . Patients pain is well controlled.     Imaging: No results found.  Significant Diagnostic Studies: CBC Lab Results  Component Value Date   WBC 6.6 05/24/2011   HGB 13.2 05/24/2011   HCT 38.2* 05/24/2011   MCV 97.7 05/24/2011   PLT 217 05/24/2011    BMET     Component Value Date/Time   NA 128* 05/24/2011 0624   K 4.2 05/24/2011 0624   CL 95* 05/24/2011 0624   CO2 27 05/24/2011 0624   GLUCOSE 117* 05/24/2011 0624   BUN 8 05/24/2011 0624   CREATININE 0.73 05/24/2011 0624   CALCIUM 8.9 05/24/2011 0624   GFRNONAA >90 05/24/2011 0624   GFRAA >90 05/24/2011 0624    COAG Lab Results  Component Value Date   INR 1.0 09/07/2005   No results found for this basename: PTT    Physical Examination  BP Readings from Last 3 Encounters:  05/24/11 103/74  05/24/11 103/74  04/30/11 150/85   Temp Readings from Last 3 Encounters:  05/24/11 99 F (37.2 C) Oral  05/24/11 99 F (37.2 C) Oral  03/20/11 97.6 F (36.4 C) Oral   SpO2 Readings from Last 3 Encounters:  05/24/11 94%  05/24/11 94%  04/30/11 100%   Pulse Readings from Last 3 Encounters:  05/24/11 100  05/24/11 100  04/30/11 70    Pt is A&O x 3 right lower extremity: Incision/s is/are clean,dry.intact, and  healing without hematoma, erythema or drainage Limb is warm; with good color BLE warm  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 Doing well with less pain in left foot /toe after PTA Post-op pain is controlled Wounds are healing well Poss DC after seen  by Dr. Berniece Andreas [phone] 05/24/2011 8:52 AM        Agree with above  Durene Cal
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
## 2011-05-28 ENCOUNTER — Ambulatory Visit: Payer: Medicaid Other | Admitting: Surgery

## 2011-05-28 ENCOUNTER — Telehealth (HOSPITAL_COMMUNITY): Payer: Self-pay | Admitting: Dietician

## 2011-06-14 ENCOUNTER — Encounter: Payer: Self-pay | Admitting: Surgery

## 2011-06-18 ENCOUNTER — Encounter: Payer: Self-pay | Admitting: Surgery

## 2011-06-18 ENCOUNTER — Ambulatory Visit (INDEPENDENT_AMBULATORY_CARE_PROVIDER_SITE_OTHER): Payer: Medicaid Other | Admitting: Surgery

## 2011-06-18 VITALS — BP 165/85 | HR 60 | Temp 97.7°F | Ht 72.0 in | Wt 165.0 lb

## 2011-06-18 DIAGNOSIS — I70219 Atherosclerosis of native arteries of extremities with intermittent claudication, unspecified extremity: Secondary | ICD-10-CM
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
## 2011-07-20 ENCOUNTER — Encounter: Payer: Self-pay | Admitting: Surgery

## 2011-07-23 ENCOUNTER — Encounter: Payer: Self-pay | Admitting: Surgery

## 2011-07-23 ENCOUNTER — Ambulatory Visit (INDEPENDENT_AMBULATORY_CARE_PROVIDER_SITE_OTHER): Payer: Medicaid Other | Admitting: Surgery

## 2011-07-23 VITALS — BP 132/91 | HR 63 | Temp 97.3°F | Ht 72.0 in | Wt 161.0 lb

## 2011-07-23 DIAGNOSIS — I70219 Atherosclerosis of native arteries of extremities with intermittent claudication, unspecified extremity: Secondary | ICD-10-CM
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
## 2011-07-30 ENCOUNTER — Ambulatory Visit: Payer: Medicaid Other | Admitting: Surgery

## 2011-08-17 ENCOUNTER — Encounter: Payer: Self-pay | Admitting: Surgery

## 2011-08-20 ENCOUNTER — Encounter: Payer: Self-pay | Admitting: Surgery

## 2011-08-20 ENCOUNTER — Ambulatory Visit (INDEPENDENT_AMBULATORY_CARE_PROVIDER_SITE_OTHER): Payer: Medicaid Other | Admitting: Surgery

## 2011-08-20 VITALS — BP 147/91 | HR 73 | Temp 97.6°F | Ht 72.0 in | Wt 156.0 lb

## 2011-08-20 DIAGNOSIS — I70219 Atherosclerosis of native arteries of extremities with intermittent claudication, unspecified extremity: Secondary | ICD-10-CM
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
## 2011-08-22 ENCOUNTER — Encounter: Payer: Self-pay | Admitting: Physical Medicine & Rehabilitation

## 2011-09-24 ENCOUNTER — Ambulatory Visit: Payer: Medicaid Other | Attending: Anesthesiology

## 2011-09-24 DIAGNOSIS — M2560 Stiffness of unspecified joint, not elsewhere classified: Secondary | ICD-10-CM | POA: Insufficient documentation

## 2011-09-24 DIAGNOSIS — IMO0001 Reserved for inherently not codable concepts without codable children: Secondary | ICD-10-CM | POA: Insufficient documentation

## 2011-09-24 DIAGNOSIS — M2550 Pain in unspecified joint: Secondary | ICD-10-CM | POA: Insufficient documentation

## 2011-10-03 ENCOUNTER — Ambulatory Visit: Payer: Medicaid Other | Admitting: Physical Medicine & Rehabilitation

## 2011-10-04 ENCOUNTER — Ambulatory Visit: Payer: Medicaid Other

## 2011-10-08 ENCOUNTER — Ambulatory Visit: Payer: Medicaid Other

## 2011-10-22 ENCOUNTER — Other Ambulatory Visit: Payer: Self-pay | Admitting: *Deleted

## 2011-10-22 DIAGNOSIS — I70219 Atherosclerosis of native arteries of extremities with intermittent claudication, unspecified extremity: Secondary | ICD-10-CM

## 2011-10-22 DIAGNOSIS — Z48812 Encounter for surgical aftercare following surgery on the circulatory system: Secondary | ICD-10-CM

## 2011-10-23 ENCOUNTER — Encounter (INDEPENDENT_AMBULATORY_CARE_PROVIDER_SITE_OTHER): Payer: Medicaid Other | Admitting: *Deleted

## 2011-10-23 DIAGNOSIS — I739 Peripheral vascular disease, unspecified: Secondary | ICD-10-CM

## 2011-10-23 DIAGNOSIS — I70219 Atherosclerosis of native arteries of extremities with intermittent claudication, unspecified extremity: Secondary | ICD-10-CM

## 2011-10-23 DIAGNOSIS — Z48812 Encounter for surgical aftercare following surgery on the circulatory system: Secondary | ICD-10-CM

## 2011-10-26 ENCOUNTER — Encounter: Payer: Self-pay | Admitting: Surgery

## 2011-10-29 ENCOUNTER — Encounter: Payer: Self-pay | Admitting: Surgery

## 2011-10-29 ENCOUNTER — Ambulatory Visit (INDEPENDENT_AMBULATORY_CARE_PROVIDER_SITE_OTHER): Payer: Medicaid Other | Admitting: Surgery

## 2011-10-29 ENCOUNTER — Other Ambulatory Visit: Payer: Self-pay

## 2011-10-29 VITALS — BP 144/79 | HR 70 | Resp 18 | Ht 72.0 in | Wt 154.5 lb

## 2011-10-29 DIAGNOSIS — M79609 Pain in unspecified limb: Secondary | ICD-10-CM | POA: Insufficient documentation

## 2011-10-29 DIAGNOSIS — I70219 Atherosclerosis of native arteries of extremities with intermittent claudication, unspecified extremity: Secondary | ICD-10-CM
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
## 2011-11-05 ENCOUNTER — Encounter (HOSPITAL_COMMUNITY): Payer: Self-pay | Admitting: Pharmacy Technician

## 2011-11-19 MED ORDER — SODIUM CHLORIDE 0.9 % IV SOLN
INTRAVENOUS | Status: DC
  Administered 2011-11-20: 09:00:00 via INTRAVENOUS

## 2011-11-20 ENCOUNTER — Ambulatory Visit (HOSPITAL_COMMUNITY)
Admission: RE | Admit: 2011-11-20 | Discharge: 2011-11-20 | Disposition: A | Discharge status: Home / Self Care | Payer: Medicaid Other | Source: Ambulatory Visit | Attending: Surgery | Admitting: Surgery

## 2011-11-20 ENCOUNTER — Other Ambulatory Visit: Payer: Self-pay | Admitting: *Deleted

## 2011-11-20 ENCOUNTER — Telehealth: Payer: Self-pay | Admitting: Surgery

## 2011-11-20 ENCOUNTER — Encounter (HOSPITAL_COMMUNITY)
Admission: RE | Disposition: A | Discharge status: Home / Self Care | Payer: Self-pay | Source: Ambulatory Visit | Attending: Surgery

## 2011-11-20 DIAGNOSIS — I739 Peripheral vascular disease, unspecified: Secondary | ICD-10-CM

## 2011-11-20 DIAGNOSIS — I70209 Unspecified atherosclerosis of native arteries of extremities, unspecified extremity: Secondary | ICD-10-CM | POA: Insufficient documentation

## 2011-11-20 DIAGNOSIS — Z48812 Encounter for surgical aftercare following surgery on the circulatory system: Secondary | ICD-10-CM

## 2011-11-20 DIAGNOSIS — F17200 Nicotine dependence, unspecified, uncomplicated: Secondary | ICD-10-CM | POA: Insufficient documentation

## 2011-11-20 DIAGNOSIS — I70219 Atherosclerosis of native arteries of extremities with intermittent claudication, unspecified extremity: Secondary | ICD-10-CM

## 2011-11-20 HISTORY — PX: PERCUTANEOUS STENT INTERVENTION: SHX5500

## 2011-11-20 HISTORY — PX: ABDOMINAL AORTAGRAM: SHX5454

## 2011-11-20 LAB — POCT I-STAT, CHEM 8
BUN: <3 mg/dL — ABNORMAL LOW (ref 6–23)
Chloride: 96 mEq/L (ref 96–112)
Creatinine, Ser: 1 mg/dL (ref 0.50–1.35)
Potassium: 3.7 mEq/L (ref 3.5–5.1)
Sodium: 133 mEq/L — ABNORMAL LOW (ref 135–145)

## 2011-11-20 SURGERY — ABDOMINAL AORTAGRAM
Anesthesia: LOCAL

## 2011-11-20 MED ORDER — MIDAZOLAM HCL 2 MG/2ML IJ SOLN
INTRAMUSCULAR | Status: AC
  Filled 2011-11-20: qty 2

## 2011-11-20 MED ORDER — HEPARIN SODIUM (PORCINE) 1000 UNIT/ML IJ SOLN
INTRAMUSCULAR | Status: AC
  Filled 2011-11-20: qty 1

## 2011-11-20 MED ORDER — OXYCODONE-ACETAMINOPHEN 5-325 MG PO TABS
1.0000 | ORAL_TABLET | ORAL | Status: DC | PRN
  Administered 2011-11-20: 2 via ORAL

## 2011-11-20 MED ORDER — LIDOCAINE HCL (PF) 1 % IJ SOLN
INTRAMUSCULAR | Status: AC
  Filled 2011-11-20: qty 30

## 2011-11-20 MED ORDER — OXYCODONE-ACETAMINOPHEN 5-325 MG PO TABS
ORAL_TABLET | ORAL | Status: AC
  Filled 2011-11-20: qty 2

## 2011-11-20 MED ORDER — FENTANYL CITRATE 0.05 MG/ML IJ SOLN
INTRAMUSCULAR | Status: AC
  Filled 2011-11-20: qty 2

## 2011-11-20 MED ORDER — HEPARIN (PORCINE) IN NACL 2-0.9 UNIT/ML-% IJ SOLN
INTRAMUSCULAR | Status: AC
  Filled 2011-11-20: qty 500

## 2011-11-20 MED ORDER — PROTAMINE SULFATE 10 MG/ML IV SOLN
INTRAVENOUS | Status: AC
  Filled 2011-11-20: qty 5

## 2011-11-20 MED ORDER — MORPHINE SULFATE 10 MG/ML IJ SOLN: 2.0000 mg | INTRAMUSCULAR | Status: DC | PRN

## 2011-11-20 NOTE — Telephone Encounter (Signed)
Message copied by Margaretmary Eddy on Tue Nov 20, 2011  3:09 PM ------      Message from: Melene Plan      Created: Tue Nov 20, 2011  2:04 PM                   ----- Message -----         From: Nada Libman, MD         Sent: 11/20/2011   1:01 PM           To: Reuel Derby, Melene Plan, RN            11/20/2011, the patient had the following procedures:             1.  ultrasound access right common femoral artery       2.  abdominal aortogram       3.  left lower extremity runoff       4.  stent left superficial femoral artery             Please schedule him to come back in 6 weeks with a duplex ultrasound of the left leg

## 2011-11-20 NOTE — Op Note (Signed)
Vascular and Vein Specialists of [facility]  Patient name: Joe Ballard MRN: [id number] DOB: 08/13/1950 Sex: male  11/20/2011 Pre-operative Diagnosis: Recurrent stenosis, left superficial femoral artery Post-operative diagnosis:  Same Surgeon:  Jorge Ny Procedure Performed:  1.  ultrasound access right common femoral artery  2.  abdominal aortogram  3.  left lower extremity runoff  4.  stent left superficial femoral artery     Indications:  The patient has a history of an ischemic left great toe. He is previously undergone angioplasty of a high-grade stenosis. Ultrasound surveillance detected a recurrent stenosis. He comes back in today for additional evaluation and possible treatment.  Procedure:  The patient was identified in the holding area and taken to room 8.  The patient was then placed supine on the table and prepped and draped in the usual sterile fashion.  A time out was called.  Ultrasound was used to evaluate the right common femoral artery.  It was patent .  A digital ultrasound image was acquired.  A micropuncture needle was used to access the right common femoral artery under ultrasound guidance.  An 018 wire was advanced without resistance and a micropuncture sheath was placed.  The 018 wire was removed and a benson wire was placed.  The micropuncture sheath was exchanged for a 5 french sheath.  An omniflush catheter was advanced over the wire to the level of L-1.  An abdominal angiogram was obtained.  Next, using the omniflush catheter and a benson wire, the aortic bifurcation was crossed and the catheter was placed into theleft external iliac artery and left runoff was obtained.   Findings:   Aortogram:  The visualized portions of the suprarenal abdominal aorta showed no significant disease. There is no evidence of renal artery stenosis. The infrarenal abdominal aorta is widely patent. Bilateral common and external iliac arteries are widely patent.  Left Lower  Extremity:  The left common femoral artery is widely patent. The left profunda femoral artery is widely patent. The left superficial femoral artery is patent however in the adductor canal there is an area of approximately 3 cm in length with 2 areas of stenosis. The popliteal artery is patent throughout it's course. There is three-vessel runoff to the ankle. The posterior tibial is the dominant vessel across the ankle.  Intervention:  After the above images were obtained, the decision was made to proceed with intervention. Over a 035 wire a 6 [place] Ansel 45 cm sheath was placed into the left external iliac artery. The patient was fully heparinized Using the 035 wire the lesion was successfully crossed. I elected to primarily stented the area. A Abbott 6 x 40 self-expanding stent was selected and deployed. I tried to mold this with a 5 mm balloon however it would not advance through the sheath. I therefore tried to advance a 2 x 2 balloon that it too would not cross the stent. I elected to down size to a 014 wire. With this a 5 x 30 Fox SV balloon was used to mold the stent. Followup study revealed resolution of the stenosis and runoff similar to 3 intervention. At this point the catheters and wires were removed. A sheath study was performed to evaluate for closure. I deployed the Mynx device however it was a failure and therefore manual pressure was held until hemostasis was achieved.  Impression:  #1  successful stenting of recurrent stenosis within the left superficial femoral artery using a 6 x 40 self-expanding stent  Theotis Burrow, M.D. [facility] of [place] Office: [phone] Pager:  [phone]

## 2011-11-20 NOTE — Interval H&P Note (Signed)
History and Physical Interval Note:  11/20/2011 10:55 AM  Joe Ballard  has presented today for surgery, with the diagnosis of pvd  The various methods of treatment have been discussed with the patient and family. After consideration of risks, benefits and other options for treatment, the patient has consented to  Procedure(s) (LRB) with comments: ABDOMINAL AORTAGRAM (N/A) as a surgical intervention .  The patient's history has been reviewed, patient examined, no change in status, stable for surgery.  I have reviewed the patient's chart and labs.  Questions were answered to the patient's satisfaction.     BRABHAM IV, V. WELLS
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
## 2011-11-22 ENCOUNTER — Encounter: Payer: Self-pay | Admitting: Neurosurgery

## 2011-11-22 ENCOUNTER — Ambulatory Visit (INDEPENDENT_AMBULATORY_CARE_PROVIDER_SITE_OTHER): Payer: Medicaid Other | Admitting: Neurosurgery

## 2011-11-22 ENCOUNTER — Ambulatory Visit (INDEPENDENT_AMBULATORY_CARE_PROVIDER_SITE_OTHER): Payer: Medicaid Other | Admitting: Vascular Surgery

## 2011-11-22 VITALS — BP 145/85 | HR 65 | Resp 20 | Ht 72.0 in | Wt 158.0 lb

## 2011-11-22 DIAGNOSIS — S301XXA Contusion of abdominal wall, initial encounter: Secondary | ICD-10-CM

## 2011-11-22 DIAGNOSIS — I739 Peripheral vascular disease, unspecified: Secondary | ICD-10-CM

## 2011-11-22 DIAGNOSIS — Z48812 Encounter for surgical aftercare following surgery on the circulatory system: Secondary | ICD-10-CM

## 2011-11-22 DIAGNOSIS — IMO0002 Reserved for concepts with insufficient information to code with codable children: Secondary | ICD-10-CM

## 2011-11-22 NOTE — Progress Notes (Signed)
Right limited arterial groin duplex performed @ [facility] 11/22/2011

## 2011-11-22 NOTE — Progress Notes (Signed)
Subjective:     Patient ID: Joe Ballard, male   DOB: 08-12-1950, 61 y.o.   MRN: [id number]  HPI: 61 year old male patient of Dr. Myra Gianotti status post stenting of the left SFA 11/20/2011. The patient saw his primary care doctor this morning who was concerned the patient may have a pseudoaneurysm due to some swelling in the right groin incision site and referred the patient to [place] for evaluation.   Review of Systems: 12 point review of systems is notable for the difficulties described above otherwise unremarkable     Objective:   Physical Exam: Afebrile, vital signs are stable, there is some swelling in the right groin which appears to be a hematoma, there is also bruising which appears to be average for this type procedure, the patient has palpable lower extremity pulses.     Assessment:     Per Dr. Darrick Penna, we obtained ultrasound of the area to rule out pseudoaneurysm. There was no pseudoaneurysm found, Dr. Darrick Penna spoke with the patient as well as I did explain that the hematoma and bruising would self resolve over the period of about one month and there should be no further bleeding.    Plan:     The patient will followup with Dr. Myra Gianotti in 3 months per Dr. Darrick Penna recommendation. The patient's questions were encouraged and answered, he is in agreement with this plan.  Lauree Chandler ANP  Clinic M.D.: [place]

## 2011-12-31 ENCOUNTER — Ambulatory Visit: Payer: Medicaid Other | Admitting: Surgery

## 2012-02-29 ENCOUNTER — Encounter: Payer: Self-pay | Admitting: Surgery

## 2012-03-03 ENCOUNTER — Ambulatory Visit: Payer: Medicaid Other | Admitting: Surgery

## 2012-03-21 ENCOUNTER — Encounter: Payer: Self-pay | Admitting: Surgery

## 2012-03-24 ENCOUNTER — Encounter: Payer: Self-pay | Admitting: Surgery

## 2012-03-24 ENCOUNTER — Ambulatory Visit (INDEPENDENT_AMBULATORY_CARE_PROVIDER_SITE_OTHER): Payer: Medicaid Other | Admitting: Surgery

## 2012-03-24 ENCOUNTER — Encounter (INDEPENDENT_AMBULATORY_CARE_PROVIDER_SITE_OTHER): Payer: Medicaid Other | Admitting: *Deleted

## 2012-03-24 VITALS — BP 135/77 | HR 77 | Resp 16 | Ht 72.0 in | Wt 164.0 lb

## 2012-03-24 DIAGNOSIS — Z48812 Encounter for surgical aftercare following surgery on the circulatory system: Secondary | ICD-10-CM

## 2012-03-24 DIAGNOSIS — M79604 Pain in right leg: Secondary | ICD-10-CM | POA: Insufficient documentation

## 2012-03-24 DIAGNOSIS — M79609 Pain in unspecified limb: Secondary | ICD-10-CM

## 2012-03-24 DIAGNOSIS — I739 Peripheral vascular disease, unspecified: Secondary | ICD-10-CM
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
## 2012-03-31 ENCOUNTER — Other Ambulatory Visit: Payer: Self-pay | Admitting: *Deleted

## 2012-03-31 DIAGNOSIS — I739 Peripheral vascular disease, unspecified: Secondary | ICD-10-CM

## 2012-03-31 DIAGNOSIS — Z48812 Encounter for surgical aftercare following surgery on the circulatory system: Secondary | ICD-10-CM

## 2012-04-28 ENCOUNTER — Ambulatory Visit: Payer: Medicaid Other | Admitting: Surgery

## 2012-10-06 ENCOUNTER — Ambulatory Visit: Payer: Medicaid Other | Admitting: Neurosurgery

## 2012-10-31 ENCOUNTER — Encounter: Payer: Self-pay | Admitting: Family

## 2012-11-03 ENCOUNTER — Ambulatory Visit (INDEPENDENT_AMBULATORY_CARE_PROVIDER_SITE_OTHER): Payer: Medicaid Other | Admitting: Family

## 2012-11-03 ENCOUNTER — Encounter: Payer: Self-pay | Admitting: Family

## 2012-11-03 VITALS — BP 122/76 | HR 68 | Resp 16 | Ht 72.0 in | Wt 159.0 lb

## 2012-11-03 DIAGNOSIS — M79609 Pain in unspecified limb: Secondary | ICD-10-CM

## 2012-11-03 DIAGNOSIS — I70219 Atherosclerosis of native arteries of extremities with intermittent claudication, unspecified extremity: Secondary | ICD-10-CM
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
## 2012-11-03 NOTE — Patient Instructions (Addendum)
Peripheral Vascular Disease Peripheral Vascular Disease (PVD), also called Peripheral Arterial Disease (PAD), is a circulation problem caused by cholesterol (atherosclerotic plaque) deposits in the arteries. PVD commonly occurs in the lower extremities (legs) but it can occur in other areas of the body, such as your arms. The cholesterol buildup in the arteries reduces blood flow which can cause pain and other serious problems. The presence of PVD can place a person at risk for Coronary Artery Disease (CAD).  CAUSES  Causes of PVD can be many. It is usually associated with more than one risk factor such as:   High Cholesterol.  Smoking.  Diabetes.  Lack of exercise or inactivity.  High blood pressure (hypertension).  Obesity.  Family history. SYMPTOMS   When the lower extremities are affected, patients with PVD may experience:  Leg pain with exertion or physical activity. This is called INTERMITTENT CLAUDICATION. This may present as cramping or numbness with physical activity. The location of the pain is associated with the level of blockage. For example, blockage at the abdominal level (distal abdominal aorta) may result in buttock or hip pain. Lower leg arterial blockage may result in calf pain.  As PVD becomes more severe, pain can develop with less physical activity.  In people with severe PVD, leg pain may occur at rest.  Other PVD signs and symptoms:  Leg numbness or weakness.  Coldness in the affected leg or foot, especially when compared to the other leg.  A change in leg color.  Patients with significant PVD are more prone to ulcers or sores on toes, feet or legs. These may take longer to heal or may reoccur. The ulcers or sores can become infected.  If signs and symptoms of PVD are ignored, gangrene may occur. This can result in the loss of toes or loss of an entire limb.  Not all leg pain is related to PVD. Other medical conditions can cause leg pain such  as:  Blood clots (embolism) or Deep Vein Thrombosis.  Inflammation of the blood vessels (vasculitis).  Spinal stenosis. DIAGNOSIS  Diagnosis of PVD can involve several different types of tests. These can include:  Pulse Volume Recording Method (PVR). This test is simple, painless and does not involve the use of X-rays. PVR involves measuring and comparing the blood pressure in the arms and legs. An ABI (Ankle-Brachial Index) is calculated. The normal ratio of blood pressures is 1. As this number becomes smaller, it indicates more severe disease.  < 0.95  indicates significant narrowing in one or more leg vessels.  <0.8 there will usually be pain in the foot, leg or buttock with exercise.  <0.4 will usually have pain in the legs at rest.  <0.25  usually indicates limb threatening PVD.  Doppler detection of pulses in the legs. This test is painless and checks to see if you have a pulses in your legs/feet.  A dye or contrast material (a substance that highlights the blood vessels so they show up on x-ray) may be given to help your caregiver better see the arteries for the following tests. The dye is eliminated from your body by the kidney's. Your caregiver may order blood work to check your kidney function and other laboratory values before the following tests are performed:  Magnetic Resonance Angiography (MRA). An MRA is a picture study of the blood vessels and arteries. The MRA machine uses a large magnet to produce images of the blood vessels.  Computed Tomography Angiography (CTA). A CTA is a   specialized x-ray that looks at how the blood flows in your blood vessels. An IV may be inserted into your arm so contrast dye can be injected.  Angiogram. Is a procedure that uses x-rays to look at your blood vessels. This procedure is minimally invasive, meaning a small incision (cut) is made in your groin. A small tube (catheter) is then inserted into the artery of your groin. The catheter is  guided to the blood vessel or artery your caregiver wants to examine. Contrast dye is injected into the catheter. X-rays are then taken of the blood vessel or artery. After the images are obtained, the catheter is taken out. TREATMENT  Treatment of PVD involves many interventions which may include:  Lifestyle changes:  Quitting smoking.  Exercise.  Following a low fat, low cholesterol diet.  Control of diabetes.  Foot care is very important to the PVD patient. Good foot care can help prevent infection.  Medication:  Cholesterol-lowering medicine.  Blood pressure medicine.  Anti-platelet drugs.  Certain medicines may reduce symptoms of Intermittent Claudication.  Interventional/Surgical options:  Angioplasty. An Angioplasty is a procedure that inflates a balloon in the blocked artery. This opens the blocked artery to improve blood flow.  Stent Implant. A wire mesh tube (stent) is placed in the artery. The stent expands and stays in place, allowing the artery to remain open.  Peripheral Bypass Surgery. This is a surgical procedure that reroutes the blood around a blocked artery to help improve blood flow. This type of procedure may be performed if Angioplasty or stent implants are not an option. SEEK IMMEDIATE MEDICAL CARE IF:   You develop pain or numbness in your arms or legs.  Your arm or leg turns cold, becomes blue in color.  You develop redness, warmth, swelling and pain in your arms or legs. MAKE SURE YOU:   Understand these instructions.  Will watch your condition.  Will get help right away if you are not doing well or get worse. Document Released: 03/08/2004 Document Revised: 04/23/2011 Document Reviewed: 02/03/2008 Exit[employer] Patient Information 2014 [employer].   Smoking Cessation Quitting smoking is important to your health and has many advantages. However, it is not always easy to quit since nicotine is a very addictive drug. Often times, people try 3  times or more before being able to quit. This document explains the best ways for you to prepare to quit smoking. Quitting takes hard work and a lot of effort, but you can do it. ADVANTAGES OF QUITTING SMOKING  You will live longer, feel better, and live better.  Your body will feel the impact of quitting smoking almost immediately.  Within 20 minutes, blood pressure decreases. Your pulse returns to its normal level.  After 8 hours, carbon monoxide levels in the blood return to normal. Your oxygen level increases.  After 24 hours, the chance of having a heart attack starts to decrease. Your breath, hair, and body stop smelling like smoke.  After 48 hours, damaged nerve endings begin to recover. Your sense of taste and smell improve.  After 72 hours, the body is virtually free of nicotine. Your bronchial tubes relax and breathing becomes easier.  After 2 to 12 weeks, lungs can hold more air. Exercise becomes easier and circulation improves.  The risk of having a heart attack, stroke, cancer, or lung disease is greatly reduced.  After 1 year, the risk of coronary heart disease is cut in half.  After 5 years, the risk of stroke falls to   the same as a nonsmoker.  After 10 years, the risk of lung cancer is cut in half and the risk of other cancers decreases significantly.  After 15 years, the risk of coronary heart disease drops, usually to the level of a nonsmoker.  If you are pregnant, quitting smoking will improve your chances of having a healthy baby.  The people you live with, especially any children, will be healthier.  You will have extra money to spend on things other than cigarettes. QUESTIONS TO THINK ABOUT BEFORE ATTEMPTING TO QUIT You may want to talk about your answers with your caregiver.  Why do you want to quit?  If you tried to quit in the past, what helped and what did not?  What will be the most difficult situations for you after you quit? How will you plan to  handle them?  Who can help you through the tough times? Your family? Friends? A caregiver?  What pleasures do you get from smoking? What ways can you still get pleasure if you quit? Here are some questions to ask your caregiver:  How can you help me to be successful at quitting?  What medicine do you think would be best for me and how should I take it?  What should I do if I need more help?  What is smoking withdrawal like? How can I get information on withdrawal? GET READY  Set a quit date.  Change your environment by getting rid of all cigarettes, ashtrays, matches, and lighters in your home, car, or work. Do not let people smoke in your home.  Review your past attempts to quit. Think about what worked and what did not. GET SUPPORT AND ENCOURAGEMENT You have a better chance of being successful if you have help. You can get support in many ways.  Tell your family, friends, and co-workers that you are going to quit and need their support. Ask them not to smoke around you.  Get individual, group, or telephone counseling and support. Programs are available at local hospitals and health centers. Call your local health department for information about programs in your area.  Spiritual beliefs and practices may help some smokers quit.  Download a "quit meter" on your computer to keep track of quit statistics, such as how long you have gone without smoking, cigarettes not smoked, and money saved.  Get a self-help book about quitting smoking and staying off of tobacco. LEARN NEW SKILLS AND BEHAVIORS  Distract yourself from urges to smoke. Talk to someone, go for a walk, or occupy your time with a task.  Change your normal routine. Take a different route to work. Drink tea instead of coffee. Eat breakfast in a different place.  Reduce your stress. Take a hot bath, exercise, or read a book.  Plan something enjoyable to do every day. Reward yourself for not smoking.  Explore  interactive web-based programs that specialize in helping you quit. GET MEDICINE AND USE IT CORRECTLY Medicines can help you stop smoking and decrease the urge to smoke. Combining medicine with the above behavioral methods and support can greatly increase your chances of successfully quitting smoking.  Nicotine replacement therapy helps deliver nicotine to your body without the negative effects and risks of smoking. Nicotine replacement therapy includes nicotine gum, lozenges, inhalers, nasal sprays, and skin patches. Some may be available over-the-counter and others require a prescription.  Antidepressant medicine helps people abstain from smoking, but how this works is unknown. This medicine is available by prescription.    Nicotinic receptor partial agonist medicine simulates the effect of nicotine in your brain. This medicine is available by prescription. Ask your caregiver for advice about which medicines to use and how to use them based on your health history. Your caregiver will tell you what side effects to look out for if you choose to be on a medicine or therapy. Carefully read the information on the package. Do not use any other product containing nicotine while using a nicotine replacement product.  RELAPSE OR DIFFICULT SITUATIONS Most relapses occur within the first 3 months after quitting. Do not be discouraged if you start smoking again. Remember, most people try several times before finally quitting. You may have symptoms of withdrawal because your body is used to nicotine. You may crave cigarettes, be irritable, feel very hungry, cough often, get headaches, or have difficulty concentrating. The withdrawal symptoms are only temporary. They are strongest when you first quit, but they will go away within 10 14 days. To reduce the chances of relapse, try to:  Avoid drinking alcohol. Drinking lowers your chances of successfully quitting.  Reduce the amount of caffeine you consume. Once you  quit smoking, the amount of caffeine in your body increases and can give you symptoms, such as a rapid heartbeat, sweating, and anxiety.  Avoid smokers because they can make you want to smoke.  Do not let weight gain distract you. Many smokers will gain weight when they quit, usually less than 10 pounds. Eat a healthy diet and stay active. You can always lose the weight gained after you quit.  Find ways to improve your mood other than smoking. FOR MORE INFORMATION  www.smokefree.gov  Document Released: 01/23/2001 Document Revised: 07/31/2011 Document Reviewed: 05/10/2011 [employer] Patient Information 2014 [employer].  

## 2012-11-04 NOTE — Addendum Note (Signed)
Addended by: Sharee Pimple on: 11/04/2012 08:29 AM   Modules accepted: Orders

## 2012-11-12 DIAGNOSIS — Z0000 Encounter for general adult medical examination without abnormal findings: Secondary | ICD-10-CM

## 2012-11-12 HISTORY — DX: Encounter for general adult medical examination without abnormal findings: Z00.00

## 2012-11-28 ENCOUNTER — Encounter: Payer: Self-pay | Admitting: Family

## 2012-12-01 ENCOUNTER — Ambulatory Visit (INDEPENDENT_AMBULATORY_CARE_PROVIDER_SITE_OTHER)
Admission: RE | Admit: 2012-12-01 | Discharge: 2012-12-01 | Disposition: A | Discharge status: Home / Self Care | Payer: Medicaid Other | Source: Ambulatory Visit | Attending: Family | Admitting: Family

## 2012-12-01 ENCOUNTER — Encounter: Payer: Self-pay | Admitting: Family

## 2012-12-01 ENCOUNTER — Ambulatory Visit (HOSPITAL_COMMUNITY)
Admission: RE | Admit: 2012-12-01 | Discharge: 2012-12-01 | Disposition: A | Discharge status: Home / Self Care | Payer: Medicaid Other | Source: Ambulatory Visit | Attending: Family | Admitting: Family

## 2012-12-01 ENCOUNTER — Ambulatory Visit (INDEPENDENT_AMBULATORY_CARE_PROVIDER_SITE_OTHER): Payer: Medicaid Other | Admitting: Family

## 2012-12-01 VITALS — BP 135/73 | HR 65 | Resp 16 | Ht 72.0 in | Wt 166.0 lb

## 2012-12-01 DIAGNOSIS — I70219 Atherosclerosis of native arteries of extremities with intermittent claudication, unspecified extremity: Secondary | ICD-10-CM

## 2012-12-01 DIAGNOSIS — M79609 Pain in unspecified limb: Secondary | ICD-10-CM
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
## 2012-12-01 NOTE — Patient Instructions (Signed)
Peripheral Vascular Disease Peripheral Vascular Disease (PVD), also called Peripheral Arterial Disease (PAD), is a circulation problem caused by cholesterol (atherosclerotic plaque) deposits in the arteries. PVD commonly occurs in the lower extremities (legs) but it can occur in other areas of the body, such as your arms. The cholesterol buildup in the arteries reduces blood flow which can cause pain and other serious problems. The presence of PVD can place a person at risk for Coronary Artery Disease (CAD).  CAUSES  Causes of PVD can be many. It is usually associated with more than one risk factor such as:   High Cholesterol.  Smoking.  Diabetes.  Lack of exercise or inactivity.  High blood pressure (hypertension).  Obesity.  Family history. SYMPTOMS   When the lower extremities are affected, patients with PVD may experience:  Leg pain with exertion or physical activity. This is called INTERMITTENT CLAUDICATION. This may present as cramping or numbness with physical activity. The location of the pain is associated with the level of blockage. For example, blockage at the abdominal level (distal abdominal aorta) may result in buttock or hip pain. Lower leg arterial blockage may result in calf pain.  As PVD becomes more severe, pain can develop with less physical activity.  In people with severe PVD, leg pain may occur at rest.  Other PVD signs and symptoms:  Leg numbness or weakness.  Coldness in the affected leg or foot, especially when compared to the other leg.  A change in leg color.  Patients with significant PVD are more prone to ulcers or sores on toes, feet or legs. These may take longer to heal or may reoccur. The ulcers or sores can become infected.  If signs and symptoms of PVD are ignored, gangrene may occur. This can result in the loss of toes or loss of an entire limb.  Not all leg pain is related to PVD. Other medical conditions can cause leg pain such  as:  Blood clots (embolism) or Deep Vein Thrombosis.  Inflammation of the blood vessels (vasculitis).  Spinal stenosis. DIAGNOSIS  Diagnosis of PVD can involve several different types of tests. These can include:  Pulse Volume Recording Method (PVR). This test is simple, painless and does not involve the use of X-rays. PVR involves measuring and comparing the blood pressure in the arms and legs. An ABI (Ankle-Brachial Index) is calculated. The normal ratio of blood pressures is 1. As this number becomes smaller, it indicates more severe disease.  < 0.95  indicates significant narrowing in one or more leg vessels.  <0.8 there will usually be pain in the foot, leg or buttock with exercise.  <0.4 will usually have pain in the legs at rest.  <0.25  usually indicates limb threatening PVD.  Doppler detection of pulses in the legs. This test is painless and checks to see if you have a pulses in your legs/feet.  A dye or contrast material (a substance that highlights the blood vessels so they show up on x-ray) may be given to help your caregiver better see the arteries for the following tests. The dye is eliminated from your body by the kidney's. Your caregiver may order blood work to check your kidney function and other laboratory values before the following tests are performed:  Magnetic Resonance Angiography (MRA). An MRA is a picture study of the blood vessels and arteries. The MRA machine uses a large magnet to produce images of the blood vessels.  Computed Tomography Angiography (CTA). A CTA is a   specialized x-ray that looks at how the blood flows in your blood vessels. An IV may be inserted into your arm so contrast dye can be injected.  Angiogram. Is a procedure that uses x-rays to look at your blood vessels. This procedure is minimally invasive, meaning a small incision (cut) is made in your groin. A small tube (catheter) is then inserted into the artery of your groin. The catheter is  guided to the blood vessel or artery your caregiver wants to examine. Contrast dye is injected into the catheter. X-rays are then taken of the blood vessel or artery. After the images are obtained, the catheter is taken out. TREATMENT  Treatment of PVD involves many interventions which may include:  Lifestyle changes:  Quitting smoking.  Exercise.  Following a low fat, low cholesterol diet.  Control of diabetes.  Foot care is very important to the PVD patient. Good foot care can help prevent infection.  Medication:  Cholesterol-lowering medicine.  Blood pressure medicine.  Anti-platelet drugs.  Certain medicines may reduce symptoms of Intermittent Claudication.  Interventional/Surgical options:  Angioplasty. An Angioplasty is a procedure that inflates a balloon in the blocked artery. This opens the blocked artery to improve blood flow.  Stent Implant. A wire mesh tube (stent) is placed in the artery. The stent expands and stays in place, allowing the artery to remain open.  Peripheral Bypass Surgery. This is a surgical procedure that reroutes the blood around a blocked artery to help improve blood flow. This type of procedure may be performed if Angioplasty or stent implants are not an option. SEEK IMMEDIATE MEDICAL CARE IF:   You develop pain or numbness in your arms or legs.  Your arm or leg turns cold, becomes blue in color.  You develop redness, warmth, swelling and pain in your arms or legs. MAKE SURE YOU:   Understand these instructions.  Will watch your condition.  Will get help right away if you are not doing well or get worse. Document Released: 03/08/2004 Document Revised: 04/23/2011 Document Reviewed: 02/03/2008 ExitCare Patient Information 2014 [employer].   Smoking Cessation Quitting smoking is important to your health and has many advantages. However, it is not always easy to quit since nicotine is a very addictive drug. Often times, people try 3  times or more before being able to quit. This document explains the best ways for you to prepare to quit smoking. Quitting takes hard work and a lot of effort, but you can do it. ADVANTAGES OF QUITTING SMOKING  You will live longer, feel better, and live better.  Your body will feel the impact of quitting smoking almost immediately.  Within 20 minutes, blood pressure decreases. Your pulse returns to its normal level.  After 8 hours, carbon monoxide levels in the blood return to normal. Your oxygen level increases.  After 24 hours, the chance of having a heart attack starts to decrease. Your breath, hair, and body stop smelling like smoke.  After 48 hours, damaged nerve endings begin to recover. Your sense of taste and smell improve.  After 72 hours, the body is virtually free of nicotine. Your bronchial tubes relax and breathing becomes easier.  After 2 to 12 weeks, lungs can hold more air. Exercise becomes easier and circulation improves.  The risk of having a heart attack, stroke, cancer, or lung disease is greatly reduced.  After 1 year, the risk of coronary heart disease is cut in half.  After 5 years, the risk of stroke falls to   the same as a nonsmoker.  After 10 years, the risk of lung cancer is cut in half and the risk of other cancers decreases significantly.  After 15 years, the risk of coronary heart disease drops, usually to the level of a nonsmoker.  If you are pregnant, quitting smoking will improve your chances of having a healthy baby.  The people you live with, especially any children, will be healthier.  You will have extra money to spend on things other than cigarettes. QUESTIONS TO THINK ABOUT BEFORE ATTEMPTING TO QUIT You may want to talk about your answers with your caregiver.  Why do you want to quit?  If you tried to quit in the past, what helped and what did not?  What will be the most difficult situations for you after you quit? How will you plan to  handle them?  Who can help you through the tough times? Your family? Friends? A caregiver?  What pleasures do you get from smoking? What ways can you still get pleasure if you quit? Here are some questions to ask your caregiver:  How can you help me to be successful at quitting?  What medicine do you think would be best for me and how should I take it?  What should I do if I need more help?  What is smoking withdrawal like? How can I get information on withdrawal? GET READY  Set a quit date.  Change your environment by getting rid of all cigarettes, ashtrays, matches, and lighters in your home, car, or work. Do not let people smoke in your home.  Review your past attempts to quit. Think about what worked and what did not. GET SUPPORT AND ENCOURAGEMENT You have a better chance of being successful if you have help. You can get support in many ways.  Tell your family, friends, and co-workers that you are going to quit and need their support. Ask them not to smoke around you.  Get individual, group, or telephone counseling and support. Programs are available at local hospitals and health centers. Call your local health department for information about programs in your area.  Spiritual beliefs and practices may help some smokers quit.  Download a "quit meter" on your computer to keep track of quit statistics, such as how long you have gone without smoking, cigarettes not smoked, and money saved.  Get a self-help book about quitting smoking and staying off of tobacco. LEARN NEW SKILLS AND BEHAVIORS  Distract yourself from urges to smoke. Talk to someone, go for a walk, or occupy your time with a task.  Change your normal routine. Take a different route to work. Drink tea instead of coffee. Eat breakfast in a different place.  Reduce your stress. Take a hot bath, exercise, or read a book.  Plan something enjoyable to do every day. Reward yourself for not smoking.  Explore  interactive web-based programs that specialize in helping you quit. GET MEDICINE AND USE IT CORRECTLY Medicines can help you stop smoking and decrease the urge to smoke. Combining medicine with the above behavioral methods and support can greatly increase your chances of successfully quitting smoking.  Nicotine replacement therapy helps deliver nicotine to your body without the negative effects and risks of smoking. Nicotine replacement therapy includes nicotine gum, lozenges, inhalers, nasal sprays, and skin patches. Some may be available over-the-counter and others require a prescription.  Antidepressant medicine helps people abstain from smoking, but how this works is unknown. This medicine is available by prescription.    Nicotinic receptor partial agonist medicine simulates the effect of nicotine in your brain. This medicine is available by prescription. Ask your caregiver for advice about which medicines to use and how to use them based on your health history. Your caregiver will tell you what side effects to look out for if you choose to be on a medicine or therapy. Carefully read the information on the package. Do not use any other product containing nicotine while using a nicotine replacement product.  RELAPSE OR DIFFICULT SITUATIONS Most relapses occur within the first 3 months after quitting. Do not be discouraged if you start smoking again. Remember, most people try several times before finally quitting. You may have symptoms of withdrawal because your body is used to nicotine. You may crave cigarettes, be irritable, feel very hungry, cough often, get headaches, or have difficulty concentrating. The withdrawal symptoms are only temporary. They are strongest when you first quit, but they will go away within 10 14 days. To reduce the chances of relapse, try to:  Avoid drinking alcohol. Drinking lowers your chances of successfully quitting.  Reduce the amount of caffeine you consume. Once you  quit smoking, the amount of caffeine in your body increases and can give you symptoms, such as a rapid heartbeat, sweating, and anxiety.  Avoid smokers because they can make you want to smoke.  Do not let weight gain distract you. Many smokers will gain weight when they quit, usually less than 10 pounds. Eat a healthy diet and stay active. You can always lose the weight gained after you quit.  Find ways to improve your mood other than smoking. FOR MORE INFORMATION  www.smokefree.gov  Document Released: 01/23/2001 Document Revised: 07/31/2011 Document Reviewed: 05/10/2011 Exit[employer] Patient Information 2014 [employer].  

## 2013-01-15 IMAGING — CR DG FOOT COMPLETE 3+V*L*
3 series · 3 of 3 positions shown · non-contrast
Comparison: None.

CLINICAL DATA: Left foot pain after the patient kicked a door.

LEFT FOOT - COMPLETE 3+ VIEW

[x foot ap left]
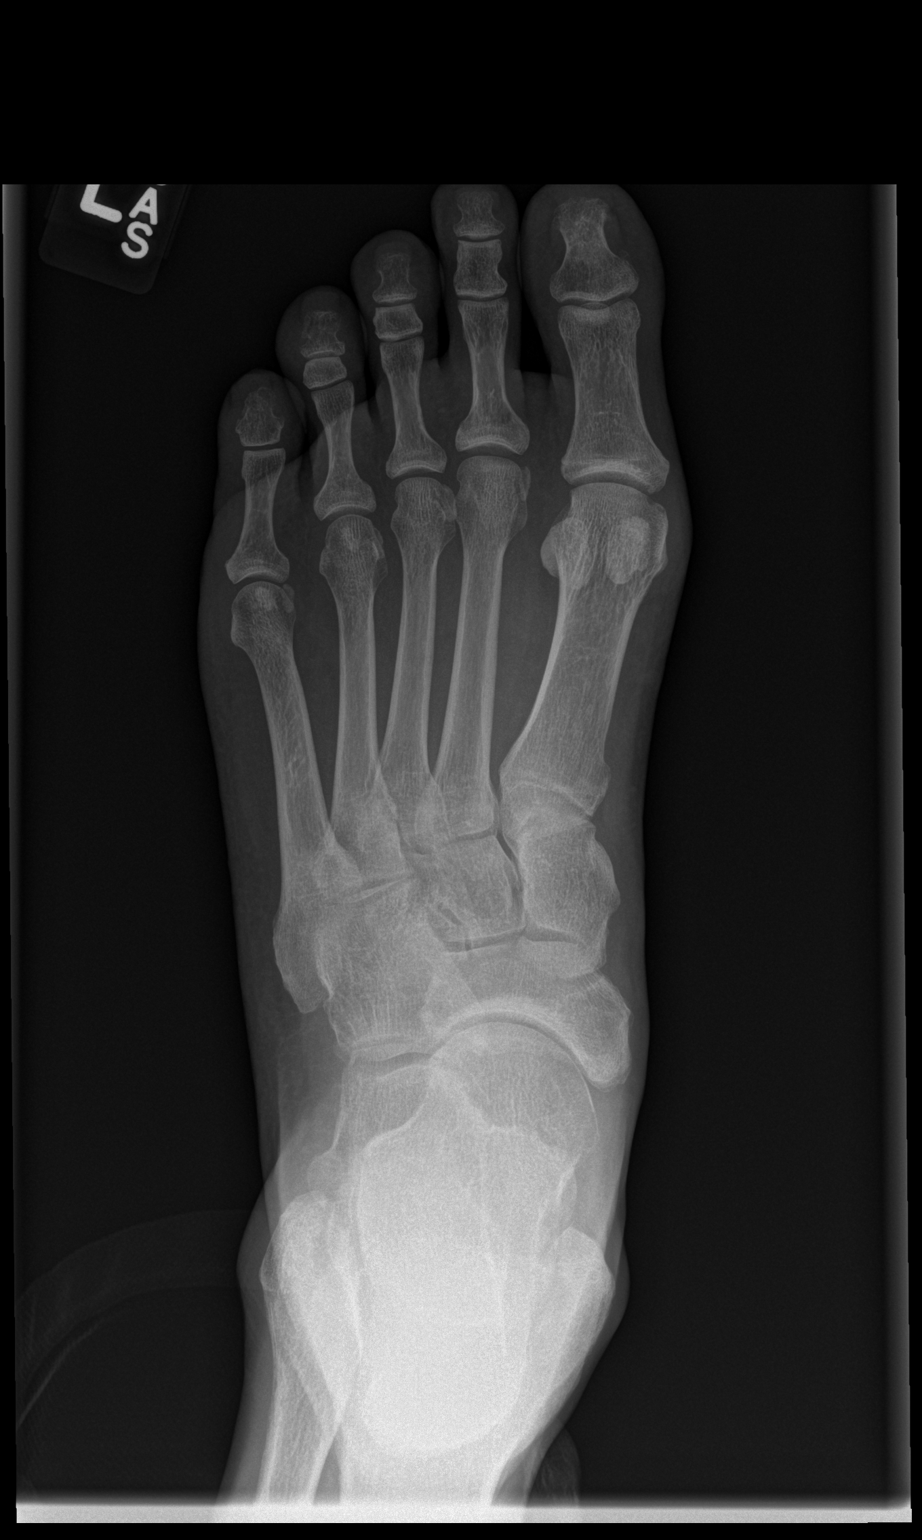

[x foot obl left]
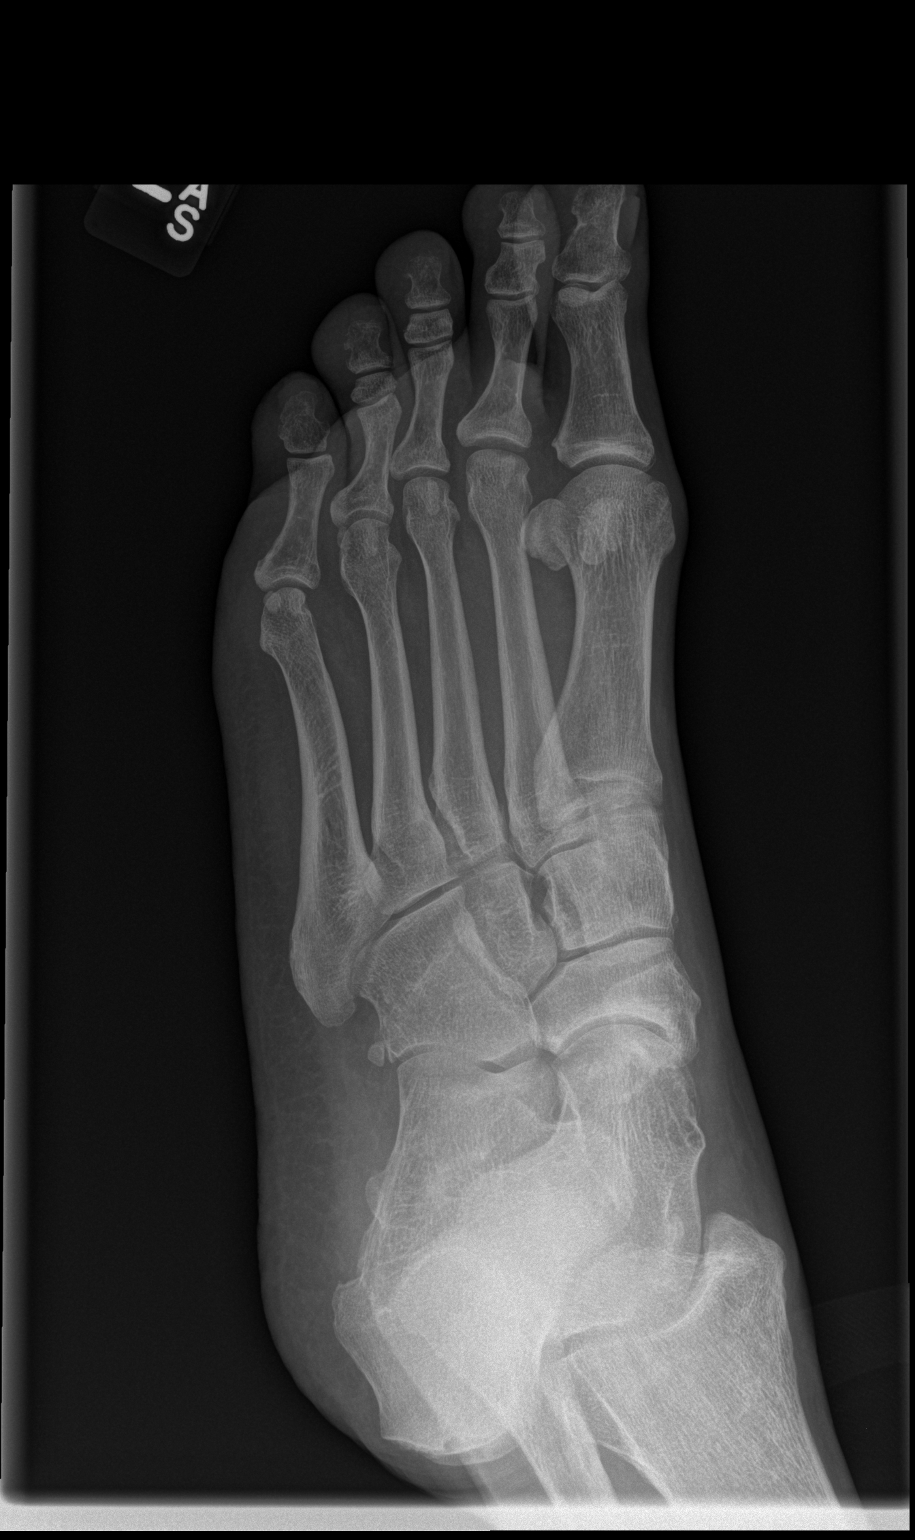

[x foot lat left]
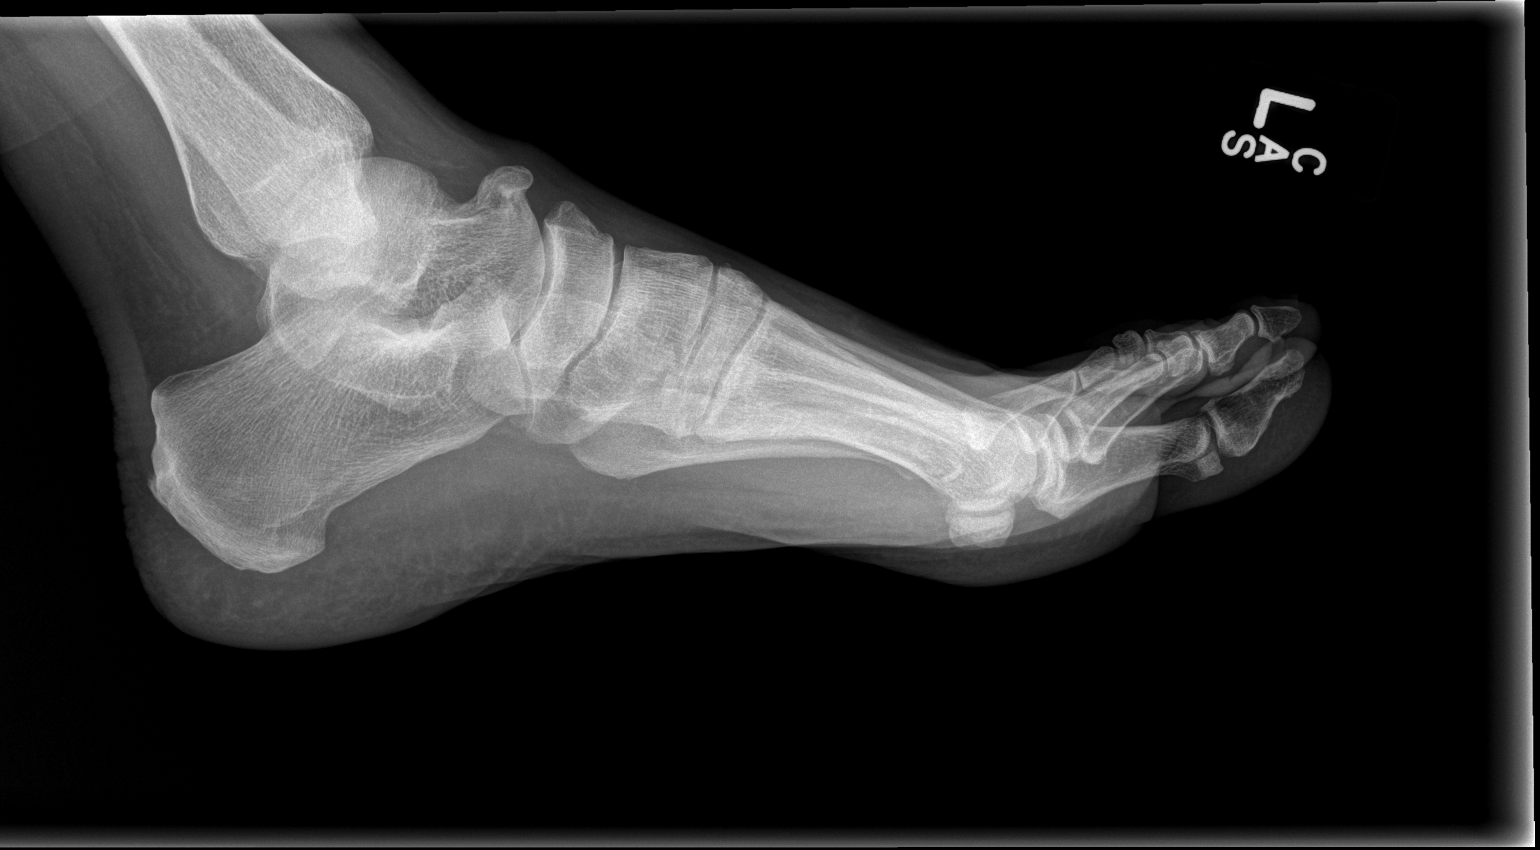

[3 of 3 positions shown; findings below may reference images not displayed]

FINDINGS: There is no acute fracture or dislocation.  Prominent
dorsal spurring at the talonavicular joint.  Minimal arthritic
changes of the first metatarsal phalangeal joint.
IMPRESSION: No acute abnormalities.

## 2013-02-06 ENCOUNTER — Ambulatory Visit (INDEPENDENT_AMBULATORY_CARE_PROVIDER_SITE_OTHER): Payer: Medicaid Other | Admitting: Vascular Surgery

## 2013-02-06 ENCOUNTER — Encounter: Payer: Self-pay | Admitting: Vascular Surgery

## 2013-02-06 ENCOUNTER — Telehealth: Payer: Self-pay | Admitting: Vascular Surgery

## 2013-02-06 VITALS — BP 137/80 | HR 74 | Resp 16 | Ht 72.0 in | Wt 163.0 lb

## 2013-02-06 DIAGNOSIS — Z48812 Encounter for surgical aftercare following surgery on the circulatory system: Secondary | ICD-10-CM | POA: Insufficient documentation

## 2013-02-06 DIAGNOSIS — I70219 Atherosclerosis of native arteries of extremities with intermittent claudication, unspecified extremity: Secondary | ICD-10-CM
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
## 2013-02-06 NOTE — Telephone Encounter (Signed)
Mr. Nitschke would like to be referred to [facility] for another opinion on his PVD.  He will research the physicians, there, and contact me back on or about 02/13/13 with the name of the physician to whom he wants to be referred. Renato Gails

## 2013-02-17 NOTE — Telephone Encounter (Signed)
Mr. Joe Ballard called, today, to say that he has arranged an appointment with [facility] and that he has received copies of his records to take to that appointment. [name]

## 2014-01-21 ENCOUNTER — Encounter (HOSPITAL_COMMUNITY): Payer: Self-pay | Admitting: Surgery

## 2015-04-08 ENCOUNTER — Emergency Department (HOSPITAL_BASED_OUTPATIENT_CLINIC_OR_DEPARTMENT_OTHER): Payer: Medicaid Other

## 2015-04-08 ENCOUNTER — Inpatient Hospital Stay (HOSPITAL_BASED_OUTPATIENT_CLINIC_OR_DEPARTMENT_OTHER)
Admission: EM | Admit: 2015-04-08 | Discharge: 2015-04-17 | DRG: 253 | Disposition: A | Discharge status: Home / Self Care | Payer: Medicaid Other | Attending: Vascular Surgery | Admitting: Vascular Surgery

## 2015-04-08 ENCOUNTER — Encounter (HOSPITAL_BASED_OUTPATIENT_CLINIC_OR_DEPARTMENT_OTHER): Payer: Self-pay | Admitting: *Deleted

## 2015-04-08 DIAGNOSIS — I739 Peripheral vascular disease, unspecified: Secondary | ICD-10-CM

## 2015-04-08 DIAGNOSIS — I998 Other disorder of circulatory system: Secondary | ICD-10-CM | POA: Diagnosis not present

## 2015-04-08 DIAGNOSIS — Z95820 Peripheral vascular angioplasty status with implants and grafts: Secondary | ICD-10-CM | POA: Diagnosis not present

## 2015-04-08 DIAGNOSIS — J9811 Atelectasis: Secondary | ICD-10-CM | POA: Diagnosis not present

## 2015-04-08 DIAGNOSIS — L03116 Cellulitis of left lower limb: Secondary | ICD-10-CM | POA: Diagnosis present

## 2015-04-08 DIAGNOSIS — F17200 Nicotine dependence, unspecified, uncomplicated: Secondary | ICD-10-CM | POA: Diagnosis not present

## 2015-04-08 DIAGNOSIS — F17210 Nicotine dependence, cigarettes, uncomplicated: Secondary | ICD-10-CM | POA: Diagnosis present

## 2015-04-08 DIAGNOSIS — M868X7 Other osteomyelitis, ankle and foot: Secondary | ICD-10-CM | POA: Diagnosis present

## 2015-04-08 DIAGNOSIS — J449 Chronic obstructive pulmonary disease, unspecified: Secondary | ICD-10-CM | POA: Diagnosis present

## 2015-04-08 DIAGNOSIS — I70245 Atherosclerosis of native arteries of left leg with ulceration of other part of foot: Principal | ICD-10-CM | POA: Diagnosis present

## 2015-04-08 DIAGNOSIS — M8610 Other acute osteomyelitis, unspecified site: Secondary | ICD-10-CM | POA: Diagnosis not present

## 2015-04-08 DIAGNOSIS — R4689 Other symptoms and signs involving appearance and behavior: Secondary | ICD-10-CM | POA: Diagnosis not present

## 2015-04-08 DIAGNOSIS — I70219 Atherosclerosis of native arteries of extremities with intermittent claudication, unspecified extremity: Secondary | ICD-10-CM | POA: Diagnosis present

## 2015-04-08 DIAGNOSIS — Z01810 Encounter for preprocedural cardiovascular examination: Secondary | ICD-10-CM | POA: Diagnosis not present

## 2015-04-08 DIAGNOSIS — F419 Anxiety disorder, unspecified: Secondary | ICD-10-CM | POA: Diagnosis present

## 2015-04-08 DIAGNOSIS — J4520 Mild intermittent asthma, uncomplicated: Secondary | ICD-10-CM

## 2015-04-08 DIAGNOSIS — M869 Osteomyelitis, unspecified: Secondary | ICD-10-CM

## 2015-04-08 DIAGNOSIS — Z9119 Patient's noncompliance with other medical treatment and regimen: Secondary | ICD-10-CM | POA: Diagnosis not present

## 2015-04-08 DIAGNOSIS — M79672 Pain in left foot: Secondary | ICD-10-CM | POA: Diagnosis present

## 2015-04-08 DIAGNOSIS — Z79899 Other long term (current) drug therapy: Secondary | ICD-10-CM | POA: Diagnosis not present

## 2015-04-08 DIAGNOSIS — R0600 Dyspnea, unspecified: Secondary | ICD-10-CM

## 2015-04-08 DIAGNOSIS — L97529 Non-pressure chronic ulcer of other part of left foot with unspecified severity: Secondary | ICD-10-CM | POA: Diagnosis present

## 2015-04-08 DIAGNOSIS — I70212 Atherosclerosis of native arteries of extremities with intermittent claudication, left leg: Secondary | ICD-10-CM | POA: Diagnosis not present

## 2015-04-08 DIAGNOSIS — F411 Generalized anxiety disorder: Secondary | ICD-10-CM | POA: Diagnosis not present

## 2015-04-08 LAB — BASIC METABOLIC PANEL
ANION GAP: 9 (ref 5–15)
BUN: 14 mg/dL (ref 6–20)
CALCIUM: 8.7 mg/dL — AB (ref 8.9–10.3)
CO2: 26 mmol/L (ref 22–32)
Chloride: 100 mmol/L — ABNORMAL LOW (ref 101–111)
Creatinine, Ser: 0.7 mg/dL (ref 0.61–1.24)
GFR calc Af Amer: >60 mL/min (ref >60)
GLUCOSE: 101 mg/dL — AB (ref 65–99)
Potassium: 3.8 mmol/L (ref 3.5–5.1)
Sodium: 135 mmol/L (ref 135–145)

## 2015-04-08 LAB — CBC WITH DIFFERENTIAL/PLATELET
Basophils Absolute: 0 10*3/uL (ref 0.0–0.1)
Basophils Relative: 0 %
EOS ABS: 0 10*3/uL (ref 0.0–0.7)
Eosinophils Relative: 0 %
HEMATOCRIT: 43.9 % (ref 39.0–52.0)
HEMOGLOBIN: 15.2 g/dL (ref 13.0–17.0)
LYMPHS ABS: 3.1 10*3/uL (ref 0.7–4.0)
Lymphocytes Relative: 29 %
MCH: 33.6 pg (ref 26.0–34.0)
MCHC: 34.6 g/dL (ref 30.0–36.0)
MCV: 96.9 fL (ref 78.0–100.0)
MONOS PCT: 8 %
Monocytes Absolute: 0.9 10*3/uL (ref 0.1–1.0)
NEUTROS PCT: 63 %
Neutro Abs: 6.8 10*3/uL (ref 1.7–7.7)
PLATELETS: 478 10*3/uL — AB (ref 150–400)
RBC: 4.53 MIL/uL (ref 4.22–5.81)
RDW: 12.5 % (ref 11.5–15.5)
WBC: 10.9 10*3/uL — ABNORMAL HIGH (ref 4.0–10.5)

## 2015-04-08 MED ORDER — ALBUTEROL SULFATE (2.5 MG/3ML) 0.083% IN NEBU: 2.5000 mg | INHALATION_SOLUTION | RESPIRATORY_TRACT | Status: DC | PRN

## 2015-04-08 MED ORDER — ONDANSETRON HCL 4 MG/2ML IJ SOLN: 4.0000 mg | Freq: Three times a day (TID) | INTRAMUSCULAR | Status: DC | PRN

## 2015-04-08 MED ORDER — DOCUSATE SODIUM 100 MG PO CAPS
100.0000 mg | ORAL_CAPSULE | Freq: Two times a day (BID) | ORAL | Status: DC
  Administered 2015-04-09 – 2015-04-16 (×15): 100 mg via ORAL
  Filled 2015-04-08 (×15): qty 1

## 2015-04-08 MED ORDER — ACETAMINOPHEN 325 MG PO TABS
650.0000 mg | ORAL_TABLET | Freq: Four times a day (QID) | ORAL | Status: DC | PRN
  Administered 2015-04-09 – 2015-04-11 (×2): 650 mg via ORAL
  Filled 2015-04-08 (×2): qty 2

## 2015-04-08 MED ORDER — SODIUM CHLORIDE 0.9 % IV SOLN: 250.0000 mL | INTRAVENOUS | Status: DC | PRN

## 2015-04-08 MED ORDER — HYDROCODONE-ACETAMINOPHEN 5-325 MG PO TABS: 1.0000 | ORAL_TABLET | ORAL | Status: DC | PRN

## 2015-04-08 MED ORDER — VANCOMYCIN HCL IN DEXTROSE 1-5 GM/200ML-% IV SOLN
1000.0000 mg | Freq: Three times a day (TID) | INTRAVENOUS | Status: DC
  Administered 2015-04-08 – 2015-04-17 (×23): 1000 mg via INTRAVENOUS
  Filled 2015-04-08 (×33): qty 200

## 2015-04-08 MED ORDER — ENOXAPARIN SODIUM 40 MG/0.4ML ~~LOC~~ SOLN
40.0000 mg | SUBCUTANEOUS | Status: DC
  Administered 2015-04-09 – 2015-04-11 (×3): 40 mg via SUBCUTANEOUS
  Filled 2015-04-08 (×3): qty 0.4

## 2015-04-08 MED ORDER — IPRATROPIUM BROMIDE 0.02 % IN SOLN
0.5000 mg | Freq: Four times a day (QID) | RESPIRATORY_TRACT | Status: DC
  Administered 2015-04-09: 0.5 mg via RESPIRATORY_TRACT
  Filled 2015-04-08: qty 2.5

## 2015-04-08 MED ORDER — ALPRAZOLAM 0.5 MG PO TABS
1.0000 mg | ORAL_TABLET | Freq: Every day | ORAL | Status: DC | PRN
  Administered 2015-04-15: 1 mg via ORAL

## 2015-04-08 MED ORDER — BISACODYL 10 MG RE SUPP: 10.0000 mg | Freq: Every day | RECTAL | Status: DC | PRN

## 2015-04-08 MED ORDER — NICOTINE 21 MG/24HR TD PT24
21.0000 mg | MEDICATED_PATCH | Freq: Every day | TRANSDERMAL | Status: DC
Start: 2015-04-09 — End: 2015-04-17
  Administered 2015-04-09 – 2015-04-17 (×8): 21 mg via TRANSDERMAL
  Filled 2015-04-08 (×8): qty 1

## 2015-04-08 MED ORDER — SODIUM CHLORIDE 0.9% FLUSH
3.0000 mL | INTRAVENOUS | Status: DC | PRN
  Administered 2015-04-09 – 2015-04-12 (×3): 3 mL via INTRAVENOUS
  Filled 2015-04-08 (×3): qty 3

## 2015-04-08 MED ORDER — SODIUM CHLORIDE 0.9% FLUSH
3.0000 mL | Freq: Two times a day (BID) | INTRAVENOUS | Status: DC
  Administered 2015-04-09 – 2015-04-17 (×12): 3 mL via INTRAVENOUS

## 2015-04-08 MED ORDER — TRAZODONE HCL 150 MG PO TABS
75.0000 mg | ORAL_TABLET | Freq: Every day | ORAL | Status: DC
  Administered 2015-04-08 – 2015-04-16 (×9): 75 mg via ORAL
  Filled 2015-04-08: qty 2
  Filled 2015-04-08 (×3): qty 1
  Filled 2015-04-08: qty 2
  Filled 2015-04-08 (×2): qty 1
  Filled 2015-04-08: qty 2
  Filled 2015-04-08: qty 1

## 2015-04-08 MED ORDER — ONDANSETRON HCL 4 MG/2ML IJ SOLN
4.0000 mg | Freq: Once | INTRAMUSCULAR | Status: AC
  Administered 2015-04-08: 4 mg via INTRAVENOUS
  Filled 2015-04-08: qty 2

## 2015-04-08 MED ORDER — CEFEPIME HCL 1 G IJ SOLR
INTRAMUSCULAR | Status: AC
  Filled 2015-04-08: qty 1

## 2015-04-08 MED ORDER — GUAIFENESIN ER 600 MG PO TB12
600.0000 mg | ORAL_TABLET | Freq: Two times a day (BID) | ORAL | Status: DC
  Administered 2015-04-09 – 2015-04-17 (×13): 600 mg via ORAL
  Filled 2015-04-08 (×14): qty 1

## 2015-04-08 MED ORDER — ACETAMINOPHEN 650 MG RE SUPP: 650.0000 mg | Freq: Four times a day (QID) | RECTAL | Status: DC | PRN

## 2015-04-08 MED ORDER — DEXTROSE 5 % IV SOLN
1.0000 g | Freq: Three times a day (TID) | INTRAVENOUS | Status: DC
  Administered 2015-04-08 – 2015-04-17 (×24): 1 g via INTRAVENOUS
  Filled 2015-04-08 (×32): qty 1

## 2015-04-08 MED ORDER — HYDROMORPHONE HCL 1 MG/ML IJ SOLN
1.0000 mg | Freq: Once | INTRAMUSCULAR | Status: AC
  Administered 2015-04-08: 1 mg via INTRAVENOUS
  Filled 2015-04-08: qty 1

## 2015-04-08 MED ORDER — POLYETHYLENE GLYCOL 3350 17 G PO PACK: 17.0000 g | PACK | Freq: Every day | ORAL | Status: DC | PRN

## 2015-04-08 MED ORDER — ALPRAZOLAM 0.5 MG PO TABS
1.0000 mg | ORAL_TABLET | Freq: Three times a day (TID) | ORAL | Status: DC
  Administered 2015-04-08 – 2015-04-17 (×23): 1 mg via ORAL
  Filled 2015-04-08 (×24): qty 2

## 2015-04-08 MED ORDER — ZOLPIDEM TARTRATE 5 MG PO TABS
10.0000 mg | ORAL_TABLET | Freq: Every day | ORAL | Status: DC
  Administered 2015-04-08 – 2015-04-16 (×9): 10 mg via ORAL
  Filled 2015-04-08 (×9): qty 2

## 2015-04-08 MED ORDER — OXYCODONE-ACETAMINOPHEN 5-325 MG PO TABS
1.0000 | ORAL_TABLET | ORAL | Status: DC | PRN
  Administered 2015-04-09 – 2015-04-10 (×6): 2 via ORAL
  Filled 2015-04-08 (×6): qty 2

## 2015-04-08 MED ORDER — HYDROMORPHONE HCL 1 MG/ML IJ SOLN
1.0000 mg | INTRAMUSCULAR | Status: DC | PRN
  Administered 2015-04-08 – 2015-04-09 (×2): 1 mg via INTRAVENOUS
  Filled 2015-04-08 (×2): qty 1

## 2015-04-08 MED ORDER — SENNA 8.6 MG PO TABS
1.0000 | ORAL_TABLET | Freq: Two times a day (BID) | ORAL | Status: DC
  Administered 2015-04-09 – 2015-04-16 (×15): 8.6 mg via ORAL
  Filled 2015-04-08 (×15): qty 1

## 2015-04-08 NOTE — ED Notes (Signed)
[facility] has been notified for transfer and a truck is on the way to carry him to 5N[facility] at [facility].

## 2015-04-08 NOTE — ED Notes (Signed)
Patient transported to X-ray 

## 2015-04-08 NOTE — ED Notes (Signed)
This rn calls bed control to check pt bed status, per Selena Batten bed has been cleaned and released. [facility] phoned and updated by [facility].
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
## 2015-04-08 NOTE — Progress Notes (Signed)
Mr bise is 60yr with h/o pvd s/p stent to left leg in 2013, presented to [facility] c/o left foot wound after he dropped TV on , x ray foot + osteo, doppler ok  Left Pt pulse but cannot doppler L, patient is started on vanc/cefepime, EDP talked to vascular surgery Dr Arbie Cookey who recommended hospital medicine admit and vascular surgery  Will see patient upon arrival to [facility].  Patient currently vital stable, accepted to med /tele.

## 2015-04-08 NOTE — ED Notes (Signed)
Posterior pedal pulse dopp. Unable to hear other on doppler.

## 2015-04-08 NOTE — Consult Note (Signed)
Pharmacy Antibiotic Note  Joe Ballard is a 65 y.o. male admitted on 04/08/2015 with left foot osteomyelitis.  Pharmacy has been consulted for vancomycin and cefepime dosing. Renal function wnl.  Plan: 1) Vancomycin 1g IV q8 2) Cefepime 1g IV q8 3) Follow renal function,cultures, LOT, level if needed  Height: 6' (182.9 cm) Weight: 163 lb (73.936 kg) IBW/kg (Calculated) : 77.6  Temp (24hrs), Avg:98.1 F (36.7 C), Min:98.1 F (36.7 C), Max:98.1 F (36.7 C)   Recent Labs Lab 04/08/15 1440  WBC 10.9*  CREATININE 0.70    Estimated Creatinine Clearance: 100.1 mL/min (by C-G formula based on Cr of 0.7).    Allergies  Allergen Reactions  . Penicillins Hives and Rash    Red bumps    Antimicrobials this admission: 2/24 Vancomycin >> 2/24 Cefepime >>  Dose adjustments this admission: n/a  Microbiology results: n/a  Thank you for allowing pharmacy to be a part of this patient's care.  Fredrik Rigger 04/08/2015 3:34 PM

## 2015-04-08 NOTE — ED Notes (Signed)
3-4 weeks left foot pain and swelling. Toes red and swollen- Pus drainage from 2nd toe. Pt SOB with walking
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
## 2015-04-09 ENCOUNTER — Inpatient Hospital Stay (HOSPITAL_COMMUNITY): Payer: Medicaid Other

## 2015-04-09 DIAGNOSIS — I739 Peripheral vascular disease, unspecified: Secondary | ICD-10-CM

## 2015-04-09 DIAGNOSIS — I998 Other disorder of circulatory system: Secondary | ICD-10-CM

## 2015-04-09 DIAGNOSIS — F17200 Nicotine dependence, unspecified, uncomplicated: Secondary | ICD-10-CM

## 2015-04-09 DIAGNOSIS — F411 Generalized anxiety disorder: Secondary | ICD-10-CM

## 2015-04-09 DIAGNOSIS — M869 Osteomyelitis, unspecified: Secondary | ICD-10-CM

## 2015-04-09 LAB — CBC
HCT: 43.4 % (ref 39.0–52.0)
Hemoglobin: 14.3 g/dL (ref 13.0–17.0)
MCH: 32.4 pg (ref 26.0–34.0)
MCHC: 32.9 g/dL (ref 30.0–36.0)
MCV: 98.4 fL (ref 78.0–100.0)
PLATELETS: 432 10*3/uL — AB (ref 150–400)
RBC: 4.41 MIL/uL (ref 4.22–5.81)
RDW: 12.9 % (ref 11.5–15.5)
WBC: 10.6 10*3/uL — ABNORMAL HIGH (ref 4.0–10.5)

## 2015-04-09 LAB — COMPREHENSIVE METABOLIC PANEL
ALT: 19 U/L (ref 17–63)
AST: 18 U/L (ref 15–41)
Albumin: 3.2 g/dL — ABNORMAL LOW (ref 3.5–5.0)
Alkaline Phosphatase: 90 U/L (ref 38–126)
Anion gap: 10 (ref 5–15)
BUN: 9 mg/dL (ref 6–20)
CHLORIDE: 103 mmol/L (ref 101–111)
CO2: 26 mmol/L (ref 22–32)
CREATININE: 0.73 mg/dL (ref 0.61–1.24)
Calcium: 8.9 mg/dL (ref 8.9–10.3)
GFR calc Af Amer: >60 mL/min (ref >60)
GFR calc non Af Amer: >60 mL/min (ref >60)
Glucose, Bld: 105 mg/dL — ABNORMAL HIGH (ref 65–99)
Potassium: 3.7 mmol/L (ref 3.5–5.1)
SODIUM: 139 mmol/L (ref 135–145)
Total Bilirubin: 0.5 mg/dL (ref 0.3–1.2)
Total Protein: 6.2 g/dL — ABNORMAL LOW (ref 6.5–8.1)

## 2015-04-09 LAB — MAGNESIUM: Magnesium: 1.9 mg/dL (ref 1.7–2.4)

## 2015-04-09 LAB — TSH: TSH: 1.492 u[IU]/mL (ref 0.350–4.500)

## 2015-04-09 LAB — PHOSPHORUS: Phosphorus: 3.7 mg/dL (ref 2.5–4.6)

## 2015-04-09 MED ORDER — ASPIRIN EC 325 MG PO TBEC
325.0000 mg | DELAYED_RELEASE_TABLET | Freq: Every day | ORAL | Status: DC
  Administered 2015-04-09 – 2015-04-12 (×4): 325 mg via ORAL
  Filled 2015-04-09 (×4): qty 1

## 2015-04-09 MED ORDER — HYDROMORPHONE HCL 1 MG/ML IJ SOLN
1.0000 mg | INTRAMUSCULAR | Status: DC | PRN
  Administered 2015-04-09 – 2015-04-10 (×3): 1 mg via INTRAVENOUS
  Filled 2015-04-09 (×3): qty 1

## 2015-04-09 MED ORDER — IPRATROPIUM-ALBUTEROL 0.5-2.5 (3) MG/3ML IN SOLN
3.0000 mL | Freq: Three times a day (TID) | RESPIRATORY_TRACT | Status: DC
  Administered 2015-04-09 – 2015-04-10 (×3): 3 mL via RESPIRATORY_TRACT
  Filled 2015-04-09 (×3): qty 3

## 2015-04-09 NOTE — Progress Notes (Addendum)
PATIENT DETAILS Name: Joe Ballard Age: 65 y.o. Sex: male Date of Birth: 16-May-1950 Admit Date: 04/08/2015 Admitting Physician Therisa Doyne, MD [name],[name], [name]  Subjective: Left leg continues to be erythematous-left foot is cold.  Assessment/Plan: Active Problems: Left second toe osteomyelitis: Continue empiric cefepime/vancomycin. Follow  Left ischemic foot: evaluated by vascular surgery, spoke with Dr. Arbie Cookey today-no role for anticoagulation in this setting, plans are for arteriogram on Monday, will likely require a redo femoropopliteal bypass.  History of peripheral vascular disease: Unfortunately noncompliant with antiplatelets, continues to smoke. Will continue to counsel.  Anxiety:Continue Xanax  Tobacco abuse: Counseled  Disposition: Remain inpatient  Antimicrobial agents  See below  Anti-infectives    Start     Dose/Rate Route Frequency Ordered Stop   04/08/15 1600  vancomycin (VANCOCIN) IVPB 1000 mg/200 mL premix     1,000 mg 200 mL/hr over 60 Minutes Intravenous Every 8 hours 04/08/15 1532     04/08/15 1600  ceFEPIme (MAXIPIME) 1 g in dextrose 5 % 50 mL IVPB     1 g 100 mL/hr over 30 Minutes Intravenous 3 times per day 04/08/15 1534     04/08/15 1544  ceFEPIme (MAXIPIME) 1 g injection    Comments:  [name]   : cabinet override      04/08/15 1544 04/09/15 0344      DVT Prophylaxis: Prophylactic Lovenox   Code Status: Full code   Family Communication None at bedside  Procedures: None  CONSULTS:  vascular surgery  Time spent 30 minutes-Greater than 50% of this time was spent in counseling, explanation of diagnosis, planning of further management, and coordination of care.  MEDICATIONS: Scheduled Meds: . ALPRAZolam  1 mg Oral TID  . ceFEPime (MAXIPIME) IV  1 g Intravenous 3 times per day  . docusate sodium  100 mg Oral BID  . enoxaparin (LOVENOX) injection  40 mg Subcutaneous Q24H  . guaiFENesin  600 mg  Oral BID  . ipratropium-albuterol  3 mL Nebulization TID  . nicotine  21 mg Transdermal Daily  . senna  1 tablet Oral BID  . sodium chloride flush  3 mL Intravenous Q12H  . traZODone  75 mg Oral QHS  . vancomycin  1,000 mg Intravenous Q8H  . zolpidem  10 mg Oral QHS   Continuous Infusions:  PRN Meds:.sodium chloride, acetaminophen **OR** acetaminophen, albuterol, ALPRAZolam, bisacodyl, oxyCODONE-acetaminophen, polyethylene glycol, sodium chloride flush    PHYSICAL EXAM: Vital signs in last 24 hours: Filed Vitals:   04/09/15 0100 04/09/15 0500 04/09/15 0737 04/09/15 1357  BP: 168/84 117/66    Pulse: 100 84    Temp: 98.1 F (36.7 C) 97.5 F (36.4 C)    TempSrc: Oral Oral    Resp: 18 18    Height:      Weight:      SpO2: 99% 94% 97% 96%    Weight change:  Filed Weights   04/08/15 1352  Weight: 73.936 kg (163 lb)   Body mass index is 22.1 kg/(m^2).   Gen Exam: Awake and alert with clear speech.   Neck: Supple, No JVD.   Chest: B/L Clear.   CVS: S1 S2 Regular, no murmurs.  Abdomen: soft, BS +, non tender, non distended.  Extremities: Left foot-lower leg erythematous, left foot is cold. The right lower extremities warm-no erythematous changes in the right lower extremity Neurologic: Non Focal.   Skin: No Rash.   Wounds:  N/A.   Intake/Output from previous day: No intake or output data in the 24 hours ending 04/09/15 1506   LAB RESULTS: CBC  Recent Labs Lab 04/08/15 1440 04/09/15 0402  WBC 10.9* 10.6*  HGB 15.2 14.3  HCT 43.9 43.4  PLT 478* 432*  MCV 96.9 98.4  MCH 33.6 32.4  MCHC 34.6 32.9  RDW 12.5 12.9  LYMPHSABS 3.1  --   MONOABS 0.9  --   EOSABS 0.0  --   BASOSABS 0.0  --     Chemistries   Recent Labs Lab 04/08/15 1440 04/09/15 0402  NA 135 139  K 3.8 3.7  CL 100* 103  CO2 26 26  GLUCOSE 101* 105*  BUN 14 9  CREATININE 0.70 0.73  CALCIUM 8.7* 8.9  MG  --  1.9    CBG: No results for input(s): GLUCAP in the last 168  hours.  GFR Estimated Creatinine Clearance: 100.1 mL/min (by C-G formula based on Cr of 0.73).  Coagulation profile No results for input(s): INR, PROTIME in the last 168 hours.  Cardiac Enzymes No results for input(s): CKMB, TROPONINI, MYOGLOBIN in the last 168 hours.  Invalid input(s): CK  Invalid input(s): POCBNP No results for input(s): DDIMER in the last 72 hours. No results for input(s): HGBA1C in the last 72 hours. No results for input(s): CHOL, HDL, LDLCALC, TRIG, CHOLHDL, LDLDIRECT in the last 72 hours.  Recent Labs  04/09/15 0402  TSH 1.492   No results for input(s): VITAMINB12, FOLATE, FERRITIN, TIBC, IRON, RETICCTPCT in the last 72 hours. No results for input(s): LIPASE, AMYLASE in the last 72 hours.  Urine Studies No results for input(s): UHGB, CRYS in the last 72 hours.  Invalid input(s): UACOL, UAPR, USPG, UPH, UTP, UGL, UKET, UBIL, UNIT, UROB, ULEU, UEPI, UWBC, URBC, UBAC, CAST, UCOM, BILUA  MICROBIOLOGY: No results found for this or any previous visit (from the past 240 hour(s)).  RADIOLOGY STUDIES/RESULTS: Dg Chest 2 View  04/09/2015  CLINICAL DATA:  Shortness of breath.  COPD EXAM: CHEST  2 VIEW COMPARISON:  01/17/2010 FINDINGS: There is hyperinflation of the lungs compatible with COPD. Heart and mediastinal contours are within normal limits. No focal opacities or effusions. No acute bony abnormality. IMPRESSION: COPD.  No active disease. Electronically Signed   By: Charlett Nose M.D.   On: 04/09/2015 09:37   Dg Foot Complete Left  04/08/2015  CLINICAL DATA:  Left foot swelling and redness. No known injury. Duration of symptoms 3 weeks. EXAM: LEFT FOOT - COMPLETE 3+ VIEW COMPARISON:  02/23/2011 FINDINGS: There is soft tissue deformity of the distal aspect of the second toe with erosion of the distal portion of the distal phalanx consistent with osteomyelitis. The other regional bones are osteopenic but there is no other sign of focal bone infection. IMPRESSION:  Abnormal soft tissue appearance of the second toe with erosion of the distal portion of the distal phalanx consistent with osteomyelitis. Electronically Signed   By: Paulina Fusi M.D.   On: 04/08/2015 15:09    Jeoffrey Massed, MD  [facility] Pager:[phone]  If 7PM-7AM, please contact night-coverage [email or web address] Password [email or web address] 04/09/2015, 3:06 PM   LOS: 1 day

## 2015-04-09 NOTE — Progress Notes (Signed)
Paged On Call [facility] service at this time regarding pt c/o pain med regimen for LLE not being effective at this time. Pending call back.
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
## 2015-04-09 NOTE — Progress Notes (Signed)
VASCULAR LAB PRELIMINARY  ARTERIAL  ABI completed:  Right ABI moderate decrease in flow.  Left ABI severe decrease.  Note:  Left PT may be pulsatile vein rather than artery.    RIGHT    LEFT    PRESSURE WAVEFORM  PRESSURE WAVEFORM  BRACHIAL 130 Triphasic  BRACHIAL 128 Triphasic   DP 74 Dampened monophasic  DP absent absent  AT   AT    PT 82 Dampened monophasic  PT 28 Dampened monophasic  May be pulsatile vein  PER   PER    GREAT TOE  NA GREAT TOE  NA    RIGHT LEFT  ABI 0.63 0.22     Jania Steinke, RVT 04/09/2015, 9:48 AM

## 2015-04-10 DIAGNOSIS — I70212 Atherosclerosis of native arteries of extremities with intermittent claudication, left leg: Secondary | ICD-10-CM

## 2015-04-10 MED ORDER — OXYCODONE HCL 5 MG PO TABS
10.0000 mg | ORAL_TABLET | ORAL | Status: DC | PRN
  Administered 2015-04-10 – 2015-04-11 (×4): 15 mg via ORAL
  Filled 2015-04-10 (×4): qty 3

## 2015-04-10 MED ORDER — HYDROMORPHONE HCL 1 MG/ML IJ SOLN
1.0000 mg | INTRAMUSCULAR | Status: DC | PRN
  Administered 2015-04-10 – 2015-04-11 (×4): 2 mg via INTRAVENOUS
  Filled 2015-04-10 (×5): qty 2

## 2015-04-10 NOTE — Progress Notes (Signed)
Subjective: Interval History: none.. Still with pain in left foot  Objective: Vital signs in last 24 hours: Temp:  [98 F (36.7 C)-98.5 F (36.9 C)] 98 F (36.7 C) (02/26 0518) Pulse Rate:  [82-83] 82 (02/26 0518) Resp:  [18] 18 (02/26 0518) BP: (126-140)/(70-71) 126/70 mmHg (02/26 0518) SpO2:  [96 %-97 %] 97 % (02/26 0518)  Intake/Output from previous day: 02/25 0701 - 02/26 0700 In: 2060 [P.O.:1310; IV Piggyback:750] Out: 650 [Urine:650] Intake/Output this shift:   Physical exam: Dependent rubor of left foot. Superficial ulceration over the medial foot and lateral malleolus Palpable femoral and absent distal pulses No evidence of severe ischemia on right foot   Noninvasive studies revealed ankle arm index of 0.2 on the left which may be venous   Lab Results:  Recent Labs  04/08/15 1440 04/09/15 0402  WBC 10.9* 10.6*  HGB 15.2 14.3  HCT 43.9 43.4  PLT 478* 432*   BMET  Recent Labs  04/08/15 1440 04/09/15 0402  NA 135 139  K 3.8 3.7  CL 100* 103  CO2 26 26  GLUCOSE 101* 105*  BUN 14 9  CREATININE 0.70 0.73  CALCIUM 8.7* 8.9    Studies/Results: Dg Chest 2 View  04/09/2015  CLINICAL DATA:  Shortness of breath.  COPD EXAM: CHEST  2 VIEW COMPARISON:  01/17/2010 FINDINGS: There is hyperinflation of the lungs compatible with COPD. Heart and mediastinal contours are within normal limits. No focal opacities or effusions. No acute bony abnormality. IMPRESSION: COPD.  No active disease. Electronically Signed   By: Charlett Nose M.D.   On: 04/09/2015 09:37   Dg Foot Complete Left  04/08/2015  CLINICAL DATA:  Left foot swelling and redness. No known injury. Duration of symptoms 3 weeks. EXAM: LEFT FOOT - COMPLETE 3+ VIEW COMPARISON:  02/23/2011 FINDINGS: There is soft tissue deformity of the distal aspect of the second toe with erosion of the distal portion of the distal phalanx consistent with osteomyelitis. The other regional bones are osteopenic but there is no other  sign of focal bone infection. IMPRESSION: Abnormal soft tissue appearance of the second toe with erosion of the distal portion of the distal phalanx consistent with osteomyelitis. Electronically Signed   By: Paulina Fusi M.D.   On: 04/08/2015 15:09   Anti-infectives: Anti-infectives    Start     Dose/Rate Route Frequency Ordered Stop   04/08/15 1600  vancomycin (VANCOCIN) IVPB 1000 mg/200 mL premix     1,000 mg 200 mL/hr over 60 Minutes Intravenous Every 8 hours 04/08/15 1532     04/08/15 1600  ceFEPIme (MAXIPIME) 1 g in dextrose 5 % 50 mL IVPB     1 g 100 mL/hr over 30 Minutes Intravenous 3 times per day 04/08/15 1534     04/08/15 1544  ceFEPIme (MAXIPIME) 1 g injection    Comments:  [name]   : cabinet override      04/08/15 1544 04/09/15 0344      Assessment/Plan: s/p * No surgery found * Critical limb ischemia of left foot. For arteriogram tomorrow. Patient's very hospitalist stating that Dr. Myra Gianotti and Imogene Burn both were dishonest with him. Dr. Kathrin Penner only exposure was a second opinion where he discussed options for vascularization. The patient had a left infrainguinal bypass at [facility] just over one year ago which has failed. Explained all the options are left above-knee amputation for attempted revascularization. Explained that our group is the only group in [place]  and that he can be  transferred if he does not go comfortable with [place]. Otherwise he needs to discontinue his hospital and this. She remarks regarding our practice. Very difficult management patient. Explained that there is a significant chance that he does not have a revascularization option due to his failed prior femoropopliteal bypass. Also explained that he would have to have harvest of vein from his right leg. Plan arteriogram tomorrow. Explained that Dr. Imogene Burn would probably be doing the procedure. We'll make further recommendations pending these results   LOS: 2 days   Joe Ballard 04/10/2015, 8:56 AM

## 2015-04-10 NOTE — Progress Notes (Signed)
PATIENT DETAILS Name: Joe Ballard Age: 65 y.o. Sex: male Date of Birth: [birth year] Admit Date: 04/08/2015 Admitting Physician Therisa Doyne, MD ZOX:[id number],UJWJ C, [name]  Subjective: Left leg continues to be erythematous-left foot is cold.  Assessment/Plan: Active Problems: Left second toe osteomyelitis: Continue empiric cefepime/vancomycin. Follow  Left ischemic foot: evaluated by vascular surgery, spoke with Dr. Arbie Cookey 2/25-no role for anticoagulation in this setting, plans are for arteriogram on Monday, will likely require a redo femoropopliteal bypass. Check lipid panel, A1c  History of peripheral vascular disease: Unfortunately noncompliant with antiplatelets, continues to smoke. Will continue to counsel.  Anxiety:Continue Xanax  Tobacco abuse: Counseled  Disposition: Remain inpatient  Antimicrobial agents  See below  Anti-infectives    Start     Dose/Rate Route Frequency Ordered Stop   04/08/15 1600  vancomycin (VANCOCIN) IVPB 1000 mg/200 mL premix     1,000 mg 200 mL/hr over 60 Minutes Intravenous Every 8 hours 04/08/15 1532     04/08/15 1600  ceFEPIme (MAXIPIME) 1 g in dextrose 5 % 50 mL IVPB     1 g 100 mL/hr over 30 Minutes Intravenous 3 times per day 04/08/15 1534     04/08/15 1544  ceFEPIme (MAXIPIME) 1 g injection    Comments:  [name]   : cabinet override      04/08/15 1544 04/09/15 0344      DVT Prophylaxis: Prophylactic Lovenox   Code Status: Full code   Family Communication None at bedside  Procedures: None  CONSULTS:  vascular surgery  Time spent 30 minutes-Greater than 50% of this time was spent in counseling, explanation of diagnosis, planning of further management, and coordination of care.  MEDICATIONS: Scheduled Meds: . ALPRAZolam  1 mg Oral TID  . aspirin EC  325 mg Oral Daily  . ceFEPime (MAXIPIME) IV  1 g Intravenous 3 times per day  . docusate sodium  100 mg Oral BID  . enoxaparin (LOVENOX)  injection  40 mg Subcutaneous Q24H  . guaiFENesin  600 mg Oral BID  . nicotine  21 mg Transdermal Daily  . senna  1 tablet Oral BID  . sodium chloride flush  3 mL Intravenous Q12H  . traZODone  75 mg Oral QHS  . vancomycin  1,000 mg Intravenous Q8H  . zolpidem  10 mg Oral QHS   Continuous Infusions:  PRN Meds:.sodium chloride, acetaminophen **OR** acetaminophen, albuterol, ALPRAZolam, bisacodyl, HYDROmorphone (DILAUDID) injection, oxyCODONE, polyethylene glycol, sodium chloride flush    PHYSICAL EXAM: Vital signs in last 24 hours: Filed Vitals:   04/09/15 2001 04/09/15 2301 04/10/15 0518 04/10/15 0920  BP:  140/71 126/70   Pulse:  83 82   Temp:  98.5 F (36.9 C) 98 F (36.7 C)   TempSrc:  Oral Oral   Resp:  18 18   Height:      Weight:      SpO2: 96% 97% 97% 98%    Weight change:  Filed Weights   04/08/15 1352  Weight: 73.936 kg (163 lb)   Body mass index is 22.1 kg/(m^2).   Gen Exam: Awake and alert with clear speech.   Neck: Supple, No JVD.   Chest: B/L Clear.   CVS: S1 S2 Regular, no murmurs.  Abdomen: soft, BS +, non tender, non distended.  Extremities: Left foot-lower leg erythematous, left foot is cold. The right lower extremities warm-no erythematous changes in the right lower extremity Neurologic: Non Focal.  Skin: No Rash.   Wounds: N/A.   Intake/Output from previous day:  Intake/Output Summary (Last 24 hours) at 04/10/15 1340 Last data filed at 04/10/15 0553  Gross per 24 hour  Intake   1810 ml  Output    650 ml  Net   1160 ml     LAB RESULTS: CBC  Recent Labs Lab 04/08/15 1440 04/09/15 0402  WBC 10.9* 10.6*  HGB 15.2 14.3  HCT 43.9 43.4  PLT 478* 432*  MCV 96.9 98.4  MCH 33.6 32.4  MCHC 34.6 32.9  RDW 12.5 12.9  LYMPHSABS 3.1  --   MONOABS 0.9  --   EOSABS 0.0  --   BASOSABS 0.0  --     Chemistries   Recent Labs Lab 04/08/15 1440 04/09/15 0402  NA 135 139  K 3.8 3.7  CL 100* 103  CO2 26 26  GLUCOSE 101* 105*  BUN 14  9  CREATININE 0.70 0.73  CALCIUM 8.7* 8.9  MG  --  1.9    CBG: No results for input(s): GLUCAP in the last 168 hours.  GFR Estimated Creatinine Clearance: 100.1 mL/min (by C-G formula based on Cr of 0.73).  Coagulation profile No results for input(s): INR, PROTIME in the last 168 hours.  Cardiac Enzymes No results for input(s): CKMB, TROPONINI, MYOGLOBIN in the last 168 hours.  Invalid input(s): CK  Invalid input(s): POCBNP No results for input(s): DDIMER in the last 72 hours. No results for input(s): HGBA1C in the last 72 hours. No results for input(s): CHOL, HDL, LDLCALC, TRIG, CHOLHDL, LDLDIRECT in the last 72 hours.  Recent Labs  04/09/15 0402  TSH 1.492   No results for input(s): VITAMINB12, FOLATE, FERRITIN, TIBC, IRON, RETICCTPCT in the last 72 hours. No results for input(s): LIPASE, AMYLASE in the last 72 hours.  Urine Studies No results for input(s): UHGB, CRYS in the last 72 hours.  Invalid input(s): UACOL, UAPR, USPG, UPH, UTP, UGL, UKET, UBIL, UNIT, UROB, ULEU, UEPI, UWBC, URBC, UBAC, CAST, UCOM, BILUA  MICROBIOLOGY: No results found for this or any previous visit (from the past 240 hour(s)).  RADIOLOGY STUDIES/RESULTS: Dg Chest 2 View  04/09/2015  CLINICAL DATA:  Shortness of breath.  COPD EXAM: CHEST  2 VIEW COMPARISON:  01/17/2010 FINDINGS: There is hyperinflation of the lungs compatible with COPD. Heart and mediastinal contours are within normal limits. No focal opacities or effusions. No acute bony abnormality. IMPRESSION: COPD.  No active disease. Electronically Signed   By: Charlett Nose M.D.   On: 04/09/2015 09:37   Dg Foot Complete Left  04/08/2015  CLINICAL DATA:  Left foot swelling and redness. No known injury. Duration of symptoms 3 weeks. EXAM: LEFT FOOT - COMPLETE 3+ VIEW COMPARISON:  02/23/2011 FINDINGS: There is soft tissue deformity of the distal aspect of the second toe with erosion of the distal portion of the distal phalanx consistent with  osteomyelitis. The other regional bones are osteopenic but there is no other sign of focal bone infection. IMPRESSION: Abnormal soft tissue appearance of the second toe with erosion of the distal portion of the distal phalanx consistent with osteomyelitis. Electronically Signed   By: Paulina Fusi M.D.   On: 04/08/2015 15:09    Jeoffrey Massed, MD  [facility] Pager:[phone]  If 7PM-7AM, please contact night-coverage [email or web address] Password [email or web address] 04/10/2015, 1:40 PM   LOS: 2 days

## 2015-04-11 ENCOUNTER — Encounter (HOSPITAL_COMMUNITY)
Admission: EM | Disposition: A | Discharge status: Home / Self Care | Payer: Self-pay | Source: Home / Self Care | Attending: Internal Medicine

## 2015-04-11 ENCOUNTER — Inpatient Hospital Stay (HOSPITAL_COMMUNITY): Payer: Medicaid Other

## 2015-04-11 DIAGNOSIS — Z01810 Encounter for preprocedural cardiovascular examination: Secondary | ICD-10-CM

## 2015-04-11 DIAGNOSIS — M8610 Other acute osteomyelitis, unspecified site: Secondary | ICD-10-CM

## 2015-04-11 DIAGNOSIS — I998 Other disorder of circulatory system: Secondary | ICD-10-CM

## 2015-04-11 HISTORY — PX: PERIPHERAL VASCULAR CATHETERIZATION: SHX172C

## 2015-04-11 LAB — LIPID PANEL
CHOLESTEROL: 96 mg/dL (ref 0–200)
HDL: 29 mg/dL — ABNORMAL LOW (ref >40)
LDL Cholesterol: 43 mg/dL (ref 0–99)
Total CHOL/HDL Ratio: 3.3 RATIO
Triglycerides: 120 mg/dL (ref <150)
VLDL: 24 mg/dL (ref 0–40)

## 2015-04-11 LAB — HEMOGLOBIN A1C
HEMOGLOBIN A1C: 5.5 % (ref 4.8–5.6)
MEAN PLASMA GLUCOSE: 111 mg/dL

## 2015-04-11 SURGERY — ABDOMINAL AORTOGRAM W/LOWER EXTREMITY
Anesthesia: LOCAL

## 2015-04-11 MED ORDER — FENTANYL CITRATE (PF) 100 MCG/2ML IJ SOLN
INTRAMUSCULAR | Status: AC
  Filled 2015-04-11: qty 2

## 2015-04-11 MED ORDER — FENTANYL CITRATE (PF) 100 MCG/2ML IJ SOLN
INTRAMUSCULAR | Status: DC | PRN
  Administered 2015-04-11: 25 ug via INTRAVENOUS

## 2015-04-11 MED ORDER — ACETAMINOPHEN 325 MG PO TABS: 650.0000 mg | ORAL_TABLET | ORAL | Status: DC | PRN

## 2015-04-11 MED ORDER — HYDROMORPHONE HCL 1 MG/ML IJ SOLN
2.0000 mg | INTRAMUSCULAR | Status: DC | PRN
  Administered 2015-04-12: 3 mg via INTRAVENOUS
  Administered 2015-04-12 – 2015-04-14 (×4): 2 mg via INTRAVENOUS
  Administered 2015-04-16: 3 mg via INTRAVENOUS
  Administered 2015-04-17: 2 mg via INTRAVENOUS
  Filled 2015-04-11 (×4): qty 2
  Filled 2015-04-11: qty 3
  Filled 2015-04-11: qty 2
  Filled 2015-04-11: qty 3

## 2015-04-11 MED ORDER — LIDOCAINE HCL (PF) 1 % IJ SOLN
INTRAMUSCULAR | Status: AC
  Filled 2015-04-11: qty 30

## 2015-04-11 MED ORDER — MIDAZOLAM HCL 2 MG/2ML IJ SOLN
INTRAMUSCULAR | Status: AC
  Filled 2015-04-11: qty 2

## 2015-04-11 MED ORDER — MIDAZOLAM HCL 2 MG/2ML IJ SOLN
INTRAMUSCULAR | Status: DC | PRN
  Administered 2015-04-11: 1 mg via INTRAVENOUS

## 2015-04-11 MED ORDER — KCL IN DEXTROSE-NACL 20-5-0.45 MEQ/L-%-% IV SOLN
INTRAVENOUS | Status: DC
  Administered 2015-04-11 – 2015-04-13 (×3): via INTRAVENOUS
  Filled 2015-04-11 (×4): qty 1000

## 2015-04-11 MED ORDER — HEPARIN (PORCINE) IN NACL 2-0.9 UNIT/ML-% IJ SOLN
INTRAMUSCULAR | Status: DC | PRN
Start: 2015-04-11 — End: 2015-04-11
  Administered 2015-04-11: 1000 mL via INTRA_ARTERIAL

## 2015-04-11 MED ORDER — OXYCODONE HCL 5 MG PO TABS
20.0000 mg | ORAL_TABLET | Freq: Four times a day (QID) | ORAL | Status: DC | PRN
  Administered 2015-04-11 – 2015-04-14 (×10): 30 mg via ORAL
  Administered 2015-04-14: 25 mg via ORAL
  Administered 2015-04-15 – 2015-04-17 (×6): 30 mg via ORAL
  Filled 2015-04-11 (×17): qty 6

## 2015-04-11 MED ORDER — ONDANSETRON HCL 4 MG/2ML IJ SOLN: 4.0000 mg | Freq: Four times a day (QID) | INTRAMUSCULAR | Status: DC | PRN

## 2015-04-11 MED ORDER — LIDOCAINE HCL (PF) 1 % IJ SOLN
INTRAMUSCULAR | Status: DC | PRN
  Administered 2015-04-11: 14 mL via SUBCUTANEOUS

## 2015-04-11 MED ORDER — POLYETHYLENE GLYCOL 3350 17 G PO PACK
17.0000 g | PACK | Freq: Every day | ORAL | Status: DC
  Administered 2015-04-11 – 2015-04-17 (×5): 17 g via ORAL
  Filled 2015-04-11 (×6): qty 1

## 2015-04-11 MED ORDER — ENOXAPARIN SODIUM 40 MG/0.4ML ~~LOC~~ SOLN
40.0000 mg | SUBCUTANEOUS | Status: DC
  Filled 2015-04-11: qty 0.4

## 2015-04-11 MED ORDER — IODIXANOL 320 MG/ML IV SOLN
INTRAVENOUS | Status: DC | PRN
  Administered 2015-04-11: 127 mL via INTRA_ARTERIAL

## 2015-04-11 MED ORDER — HEPARIN (PORCINE) IN NACL 2-0.9 UNIT/ML-% IJ SOLN
INTRAMUSCULAR | Status: AC
  Filled 2015-04-11: qty 500

## 2015-04-11 SURGICAL SUPPLY — 9 items
CATH ANGIO 5F PIGTAIL 65CM (CATHETERS) ×1 IMPLANT
COVER PRB 48X5XTLSCP FOLD TPE (BAG) IMPLANT
COVER PROBE 5X48 (BAG) ×2
KIT PV (KITS) ×2 IMPLANT
SHEATH PINNACLE 5F 10CM (SHEATH) ×1 IMPLANT
SYR MEDRAD MARK V 150ML (SYRINGE) ×2 IMPLANT
TRANSDUCER W/STOPCOCK (MISCELLANEOUS) ×2 IMPLANT
TRAY PV CATH (CUSTOM PROCEDURE TRAY) ×2 IMPLANT
WIRE HITORQ VERSACORE ST 145CM (WIRE) ×1 IMPLANT

## 2015-04-11 NOTE — Progress Notes (Signed)
Pt transported off unit to cath lab for procedure. P. Amo Ozell Ferrera RN 

## 2015-04-11 NOTE — Progress Notes (Signed)
Patient ID: Joe Ballard, male   DOB: 1950-12-29, 65 y.o.   MRN: [id number] Patient underwent uneventful aortogram with bilateral motion runoff. 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 The bypass that had been placed at [facility] several years ago was completely occluded as assumed. He does have acceptable below-knee popliteal artery for anastomosis. He does have motor and sensory function in his left foot. Erythema somewhat improved. He does have area over the medial arch and lateral malleolus with open wound. The area and the arch was from a trauma dropping a television on his foot. Discussed the need for femoropopliteal bypass. Tentatively plan this for Wednesday assuming that his foot continues to improve with antibiotics elevation

## 2015-04-11 NOTE — Consult Note (Signed)
Pharmacy Antibiotic Note  Joe Ballard is a 65 y.o. male admitted on 04/08/2015 with left foot osteomyelitis.  Pharmacy has been consulted for vancomycin and cefepime dosing. Renal function wnl.  Plan: 1) Vancomycin 1g IV q8 2) Cefepime 1g IV q8 3) Follow renal function,cultures, LOT, VT tomorrow  Height: 6' (182.9 cm) Weight: 163 lb (73.936 kg) IBW/kg (Calculated) : 77.6  Temp (24hrs), Avg:98.6 F (37 C), Min:98.1 F (36.7 C), Max:99 F (37.2 C)   Recent Labs Lab 04/08/15 1440 04/09/15 0402  WBC 10.9* 10.6*  CREATININE 0.70 0.73    Estimated Creatinine Clearance: 100.1 mL/min (by C-G formula based on Cr of 0.73).    Allergies  Allergen Reactions  . Penicillins Rash    Has patient had a PCN reaction causing immediate rash, facial/tongue/throat swelling, SOB or lightheadedness with hypotension: Yes Has patient had a PCN reaction causing severe rash involving mucus membranes or skin necrosis: No Has patient had a PCN reaction that required hospitalization No Has patient had a PCN reaction occurring within the last 10 years: No If all of the above answers are "NO", then may proceed with Cephalosporin use.  . Seroquel [Quetiapine Fumarate] Other (See Comments)    Makes him crazy    Antimicrobials this admission: 2/24 Vancomycin >> 2/24 Cefepime >>  Dose adjustments this admission: n/a  Microbiology results: n/a  Isaac Bliss, PharmD, BCPS, [facility] Clinical Pharmacist Pager [phone] 04/11/2015 10:57 AM

## 2015-04-11 NOTE — Progress Notes (Signed)
PATIENT DETAILS Name: Joe Ballard Age: 65 y.o. Sex: male Date of Birth: Oct 14, 1950 Admit Date: 04/08/2015 Admitting Physician Therisa Doyne, MD ZOX:[id number],UJWJ C, [name]  Subjective: Left leg continues to be erythematous-left foot is cold.Due for angiogram today. Complains of ongoing pain the LLE  Assessment/Plan: Active Problems: Left second toe osteomyelitis: Continue empiric cefepime/vancomycin. Follow  Left ischemic foot: evaluated by vascular surgery, spoke with Dr. Arbie Cookey 2/25-no role for anticoagulation in this setting, plans are for arteriogram today, will likely require a redo femoropopliteal bypass. Check lipid panel, A1c  History of peripheral vascular disease: Unfortunately noncompliant with antiplatelets, continues to smoke. Will continue to counsel.  Anxiety:Continue Xanax  Tobacco abuse: Counseled  Chronic Pain:continue narcotics-claims was on Oxycodone 30 mg prn at home-will continue to adjust  Disposition: Remain inpatient  Antimicrobial agents  See below  Anti-infectives    Start     Dose/Rate Route Frequency Ordered Stop   04/08/15 1600  vancomycin (VANCOCIN) IVPB 1000 mg/200 mL premix     1,000 mg 200 mL/hr over 60 Minutes Intravenous Every 8 hours 04/08/15 1532     04/08/15 1600  ceFEPIme (MAXIPIME) 1 g in dextrose 5 % 50 mL IVPB     1 g 100 mL/hr over 30 Minutes Intravenous 3 times per day 04/08/15 1534     04/08/15 1544  ceFEPIme (MAXIPIME) 1 g injection    Comments:  [name]   : cabinet override      04/08/15 1544 04/09/15 0344      DVT Prophylaxis: Prophylactic Lovenox   Code Status: Full code   Family Communication None at bedside  Procedures: None  CONSULTS:  vascular surgery  Time spent 20 minutes-Greater than 50% of this time was spent in counseling, explanation of diagnosis, planning of further management, and coordination of care.  MEDICATIONS: Scheduled Meds: . ALPRAZolam  1 mg Oral TID  .  aspirin EC  325 mg Oral Daily  . ceFEPime (MAXIPIME) IV  1 g Intravenous 3 times per day  . docusate sodium  100 mg Oral BID  . enoxaparin (LOVENOX) injection  40 mg Subcutaneous Q24H  . guaiFENesin  600 mg Oral BID  . nicotine  21 mg Transdermal Daily  . senna  1 tablet Oral BID  . sodium chloride flush  3 mL Intravenous Q12H  . traZODone  75 mg Oral QHS  . vancomycin  1,000 mg Intravenous Q8H  . zolpidem  10 mg Oral QHS   Continuous Infusions:  PRN Meds:.sodium chloride, acetaminophen **OR** acetaminophen, albuterol, ALPRAZolam, bisacodyl, HYDROmorphone (DILAUDID) injection, oxyCODONE, polyethylene glycol, sodium chloride flush    PHYSICAL EXAM: Vital signs in last 24 hours: Filed Vitals:   04/10/15 0920 04/10/15 1341 04/10/15 2035 04/11/15 0638  BP:  136/69 145/79 108/62  Pulse:  84 94 88  Temp:  98.1 F (36.7 C) 98.8 F (37.1 C) 99 F (37.2 C)  TempSrc:  Oral Oral Oral  Resp:  Height:      Weight:      SpO2: 98% 99% 95%     Weight change:  Filed Weights   04/08/15 1352  Weight: 73.936 kg (163 lb)   Body mass index is 22.1 kg/(m^2).   Gen Exam: Awake and alert with clear speech.   Neck: Supple, No JVD.   Chest: B/L Clear.  No rales CVS: S1 S2 Regular, no murmurs.  Abdomen: soft, BS +, non tender,  non distended.  Extremities: Left foot-lower leg erythematous, left foot is cold. The right lower extremities warm-no erythematous changes in the right lower extremity Neurologic: Non Focal.   Skin: No Rash.   Wounds: N/A.   Intake/Output from previous day:  Intake/Output Summary (Last 24 hours) at 04/11/15 1112 Last data filed at 04/11/15 0103  Gross per 24 hour  Intake    400 ml  Output   1000 ml  Net   -600 ml     LAB RESULTS: CBC  Recent Labs Lab 04/08/15 1440 04/09/15 0402  WBC 10.9* 10.6*  HGB 15.2 14.3  HCT 43.9 43.4  PLT 478* 432*  MCV 96.9 98.4  MCH 33.6 32.4  MCHC 34.6 32.9  RDW 12.5 12.9  LYMPHSABS 3.1  --   MONOABS 0.9  --    EOSABS 0.0  --   BASOSABS 0.0  --     Chemistries   Recent Labs Lab 04/08/15 1440 04/09/15 0402  NA 135 139  K 3.8 3.7  CL 100* 103  CO2 26 26  GLUCOSE 101* 105*  BUN 14 9  CREATININE 0.70 0.73  CALCIUM 8.7* 8.9  MG  --  1.9    CBG: No results for input(s): GLUCAP in the last 168 hours.  GFR Estimated Creatinine Clearance: 100.1 mL/min (by C-G formula based on Cr of 0.73).  Coagulation profile No results for input(s): INR, PROTIME in the last 168 hours.  Cardiac Enzymes No results for input(s): CKMB, TROPONINI, MYOGLOBIN in the last 168 hours.  Invalid input(s): CK  Invalid input(s): POCBNP No results for input(s): DDIMER in the last 72 hours.  Recent Labs  04/10/15 1528  HGBA1C 5.5    Recent Labs  04/11/15 0412  CHOL 96  HDL 29*  LDLCALC 43  TRIG 161  CHOLHDL 3.3    Recent Labs  04/09/15 0402  TSH 1.492   No results for input(s): VITAMINB12, FOLATE, FERRITIN, TIBC, IRON, RETICCTPCT in the last 72 hours. No results for input(s): LIPASE, AMYLASE in the last 72 hours.  Urine Studies No results for input(s): UHGB, CRYS in the last 72 hours.  Invalid input(s): UACOL, UAPR, USPG, UPH, UTP, UGL, UKET, UBIL, UNIT, UROB, ULEU, UEPI, UWBC, URBC, UBAC, CAST, UCOM, BILUA  MICROBIOLOGY: No results found for this or any previous visit (from the past 240 hour(s)).  RADIOLOGY STUDIES/RESULTS: Dg Chest 2 View  04/09/2015  CLINICAL DATA:  Shortness of breath.  COPD EXAM: CHEST  2 VIEW COMPARISON:  01/17/2010 FINDINGS: There is hyperinflation of the lungs compatible with COPD. Heart and mediastinal contours are within normal limits. No focal opacities or effusions. No acute bony abnormality. IMPRESSION: COPD.  No active disease. Electronically Signed   By: Charlett Nose M.D.   On: 04/09/2015 09:37   Dg Foot Complete Left  04/08/2015  CLINICAL DATA:  Left foot swelling and redness. No known injury. Duration of symptoms 3 weeks. EXAM: LEFT FOOT - COMPLETE 3+  VIEW COMPARISON:  02/23/2011 FINDINGS: There is soft tissue deformity of the distal aspect of the second toe with erosion of the distal portion of the distal phalanx consistent with osteomyelitis. The other regional bones are osteopenic but there is no other sign of focal bone infection. IMPRESSION: Abnormal soft tissue appearance of the second toe with erosion of the distal portion of the distal phalanx consistent with osteomyelitis. Electronically Signed   By: Paulina Fusi M.D.   On: 04/08/2015 15:09    Jeoffrey Massed, MD  [facility] Pager:[phone]  If 7PM-7AM, please contact night-coverage [email or web address] Password [email or web address] 04/11/2015, 11:12 AM   LOS: 3 days

## 2015-04-11 NOTE — Op Note (Signed)
    OPERATIVE REPORT  DATE OF SURGERY: 04/11/2015  PATIENT: Joe Ballard, 65 y.o. male MRN: [id number]  DOB: December 31, 1950  PRE-OPERATIVE DIAGNOSIS: Critical limb ischemia left foot  POST-OPERATIVE DIAGNOSIS:  Same  PROCEDURE: Aortogram bilateral lower extremity runoff  SURGEON:  Gretta Began, M.D.  ANESTHESIA:  1% lidocaine local  PLAN OF CARE: Holding area   PATIENT DISPOSITION:  PACU - hemodynamically stable  PROCEDURE DETAILS: She was taken to the peripheral peripheral vascular cath lab placed supine position where the area both groins were prepped and draped in usual sterile fashion. Using SonoSite ultrasound of the local anesthetic the right common femoral artery was entered with a 18-gauge needle a guidewire was passed to the level of the suprarenal aorta. A 6 French sheath was passed over the guidewire. A pigtail catheter was positioned level the suprarenal aorta and AP projections were undertaken. This revealed widely patent single renal arteries bilaterally and no evidence of flow-limiting stenosis in the aortoiliac segments. The catheter was then withdrawn down the level of the infrarenal aorta and pelvis and runoff films were obtained. This showed no evidence of stenosis in the common or external iliac arteries bilaterally. The patient had occlusion of the superficial femoral artery at the origin bilaterally with profunda reconstitution more distally. On the left the patient had a prior film below-knee popliteal bypass at an outlying institution. The cuff of the old vein femoropopliteal was seen in the above-knee popliteal. There was three-vessel runoff on the left. The dominant runoff vessel was the posterior tibial artery. The peroneal and anterior tibial arteries were patent. There was a high-grade stenosis in the anterior tibial near its origin. On the right there was reconstitution of above-knee popliteal artery with normal popliteal artery throughout its course and the  three-vessel runoff. There was atherosclerotic narrowing at the origin of the anterior tibial artery takeoff and the junction with the tibioperoneal trunk. Patient tolerated procedure without immediate complication was transferred to the holding area after sheath was pulled.   Gretta Began, M.D. 04/11/2015 2:44 PM

## 2015-04-11 NOTE — Progress Notes (Signed)
Pt belongings backed up into a bag including his wallet per NT. Bag placed in pt belongings holding area by NT. Consulting civil engineer notified. Dionne Bucy RN

## 2015-04-11 NOTE — Interval H&P Note (Signed)
History and Physical Interval Note:  04/11/2015 2:01 PM  Joe Ballard  has presented today for surgery, with the diagnosis of claudication  The various methods of treatment have been discussed with the patient and family. After consideration of risks, benefits and other options for treatment, the patient has consented to  Procedure(s): Abdominal Aortogram w/Lower Extremity (N/A) as a surgical intervention .  The patient's history has been reviewed, patient examined, no change in status, stable for surgery.  I have reviewed the patient's chart and labs.  Questions were answered to the patient's satisfaction.     Gretta Began

## 2015-04-11 NOTE — Progress Notes (Signed)
*  PRELIMINARY RESULTS* Echocardiogram 2D Echocardiogram has been performed.  Joe Ballard 04/11/2015, 11:31 AM

## 2015-04-11 NOTE — H&P (View-Only) (Signed)
Subjective: Interval History: none.. Still with pain in left foot  Objective: Vital signs in last 24 hours: Temp:  [98 F (36.7 C)-98.5 F (36.9 C)] 98 F (36.7 C) (02/26 0518) Pulse Rate:  [82-83] 82 (02/26 0518) Resp:  [18] 18 (02/26 0518) BP: (126-140)/(70-71) 126/70 mmHg (02/26 0518) SpO2:  [96 %-97 %] 97 % (02/26 0518)  Intake/Output from previous day: 02/25 0701 - 02/26 0700 In: 2060 [P.O.:1310; IV Piggyback:750] Out: 650 [Urine:650] Intake/Output this shift:   Physical exam: Dependent rubor of left foot. Superficial ulceration over the medial foot and lateral malleolus Palpable femoral and absent distal pulses No evidence of severe ischemia on right foot   Noninvasive studies revealed ankle arm index of 0.2 on the left which may be venous   Lab Results:  Recent Labs  04/08/15 1440 04/09/15 0402  WBC 10.9* 10.6*  HGB 15.2 14.3  HCT 43.9 43.4  PLT 478* 432*   BMET  Recent Labs  04/08/15 1440 04/09/15 0402  NA 135 139  K 3.8 3.7  CL 100* 103  CO2 26 26  GLUCOSE 101* 105*  BUN 14 9  CREATININE 0.70 0.73  CALCIUM 8.7* 8.9    Studies/Results: Dg Chest 2 View  04/09/2015  CLINICAL DATA:  Shortness of breath.  COPD EXAM: CHEST  2 VIEW COMPARISON:  01/17/2010 FINDINGS: There is hyperinflation of the lungs compatible with COPD. Heart and mediastinal contours are within normal limits. No focal opacities or effusions. No acute bony abnormality. IMPRESSION: COPD.  No active disease. Electronically Signed   By: Charlett Nose M.D.   On: 04/09/2015 09:37   Dg Foot Complete Left  04/08/2015  CLINICAL DATA:  Left foot swelling and redness. No known injury. Duration of symptoms 3 weeks. EXAM: LEFT FOOT - COMPLETE 3+ VIEW COMPARISON:  02/23/2011 FINDINGS: There is soft tissue deformity of the distal aspect of the second toe with erosion of the distal portion of the distal phalanx consistent with osteomyelitis. The other regional bones are osteopenic but there is no other  sign of focal bone infection. IMPRESSION: Abnormal soft tissue appearance of the second toe with erosion of the distal portion of the distal phalanx consistent with osteomyelitis. Electronically Signed   By: Paulina Fusi M.D.   On: 04/08/2015 15:09   Anti-infectives: Anti-infectives    Start     Dose/Rate Route Frequency Ordered Stop   04/08/15 1600  vancomycin (VANCOCIN) IVPB 1000 mg/200 mL premix     1,000 mg 200 mL/hr over 60 Minutes Intravenous Every 8 hours 04/08/15 1532     04/08/15 1600  ceFEPIme (MAXIPIME) 1 g in dextrose 5 % 50 mL IVPB     1 g 100 mL/hr over 30 Minutes Intravenous 3 times per day 04/08/15 1534     04/08/15 1544  ceFEPIme (MAXIPIME) 1 g injection    Comments:  [name]   : cabinet override      04/08/15 1544 04/09/15 0344      Assessment/Plan: s/p * No surgery found * Critical limb ischemia of left foot. For arteriogram tomorrow. Patient's very hospitalist stating that Dr. Myra Gianotti and Imogene Burn both were dishonest with him. Dr. Kathrin Penner only exposure was a second opinion where he discussed options for vascularization. The patient had a left infrainguinal bypass at [facility] just over one year ago which has failed. Explained all the options are left above-knee amputation for attempted revascularization. Explained that our group is the only group in [place]  and that he can be  transferred if he does not go comfortable with us. Otherwise he needs to discontinue his hospital and this. She remarks regarding our practice. Very difficult management patient. Explained that there is a significant chance that he does not have a revascularization option due to his failed prior femoropopliteal bypass. Also explained that he would have to have harvest of vein from his right leg. Plan arteriogram tomorrow. Explained that Dr. Chen would probably be doing the procedure. We'll make further recommendations pending these results   LOS: 2 days   Kadie Balestrieri 04/10/2015, 8:56 AM    

## 2015-04-12 ENCOUNTER — Encounter (HOSPITAL_COMMUNITY): Payer: Self-pay | Admitting: Vascular Surgery

## 2015-04-12 DIAGNOSIS — R4689 Other symptoms and signs involving appearance and behavior: Secondary | ICD-10-CM

## 2015-04-12 LAB — BASIC METABOLIC PANEL
Anion gap: 8 (ref 5–15)
BUN: 6 mg/dL (ref 6–20)
CALCIUM: 8.7 mg/dL — AB (ref 8.9–10.3)
CO2: 30 mmol/L (ref 22–32)
CREATININE: 0.77 mg/dL (ref 0.61–1.24)
Chloride: 97 mmol/L — ABNORMAL LOW (ref 101–111)
GFR calc non Af Amer: >60 mL/min (ref >60)
Glucose, Bld: 120 mg/dL — ABNORMAL HIGH (ref 65–99)
Potassium: 4.6 mmol/L (ref 3.5–5.1)
Sodium: 135 mmol/L (ref 135–145)

## 2015-04-12 LAB — VANCOMYCIN, TROUGH: Vancomycin Tr: 17 ug/mL (ref 10.0–20.0)

## 2015-04-12 MED ORDER — VANCOMYCIN HCL IN DEXTROSE 1-5 GM/200ML-% IV SOLN
1000.0000 mg | INTRAVENOUS | Status: AC
  Administered 2015-04-13: 1000 mg via INTRAVENOUS
  Filled 2015-04-12: qty 200

## 2015-04-12 NOTE — Progress Notes (Signed)
Utilization review completed.  

## 2015-04-12 NOTE — Progress Notes (Signed)
Pharmacy Antibiotic Note  Joe Ballard is a 65 y.o. male admitted on 04/08/2015 with left foot osteomyelitis. S/p arteriogram 2/27 which revealed complete occlusion of the superficial femoral artery. Tentative plan is for fem pop bypass on Wednesday. He continues on day #4 vancomycin and cefepime. Vancomycin trough drawn today is therapeutic at 17 (goal 15-20). Renal function is stable.     Plan: 1) Continue vancomycin 1g IV q8 2) Continue cefepime 1g IV q8  Height: 6' 0.01" (182.9 cm) Weight: 163 lb (73.936 kg) IBW/kg (Calculated) : 77.62  Temp (24hrs), Avg:98.7 F (37.1 C), Min:98 F (36.7 C), Max:99.4 F (37.4 C)   Recent Labs Lab 04/08/15 1440 04/09/15 0402 04/12/15 0401 04/12/15 0656  WBC 10.9* 10.6*  --   --   CREATININE 0.70 0.73 0.77  --   VANCOTROUGH  --   --   --  17    Estimated Creatinine Clearance: 100.1 mL/min (by C-G formula based on Cr of 0.77).    Allergies  Allergen Reactions  . Penicillins Rash    Has patient had a PCN reaction causing immediate rash, facial/tongue/throat swelling, SOB or lightheadedness with hypotension: Yes Has patient had a PCN reaction causing severe rash involving mucus membranes or skin necrosis: No Has patient had a PCN reaction that required hospitalization No Has patient had a PCN reaction occurring within the last 10 years: No If all of the above answers are "NO", then may proceed with Cephalosporin use.  . Seroquel [Quetiapine Fumarate] Other (See Comments)    Makes him crazy    Antimicrobials this admission: 2/24 Vancomycin >> 2/24 Cefepime >>   Dose adjustments this admission: 2/28 VT = 17 on 1g q8  Microbiology results: No cultures  Thank you for allowing pharmacy to be a part of this patient's care.  Fredrik Rigger 04/12/2015 11:21 AM

## 2015-04-12 NOTE — Progress Notes (Signed)
PATIENT DETAILS Name: Joe Ballard Age: 65 y.o. Sex: male Date of Birth: [birth year] Admit Date: 04/08/2015 Admitting Physician Therisa Doyne, MD ZOX:[id number],UJWJ C, [name]  Subjective: Left leg continues to be erythematous but improved-left foot is cold.underwent angiogram yesterday. Pain better controlled with adjustment of pain medications.   Assessment/Plan: Active Problems: Left second toe osteomyelitis: Continue empiric cefepime/vancomycin. Spoke with Dr. early-recommended to continue IV anti-await area to demarcate post bypass/revascularization before proceeding with amputation.   Left ischemic foot: evaluated by vascular surgery, spoke with Dr. Arbie Cookey 2/25-no role for anticoagulation in this setting, underwent angiogram on 2/27 which demonstrated superficial femoral artery occlusion, vascular surgery planning on bypass 3/1. LDL 43, A1c 8.5, continue aspirin.  History of peripheral vascular disease: Unfortunately noncompliant with antiplatelets, continues to smoke. Will continue to counsel.  Anxiety:Continue Xanax  Tobacco abuse: Counseled  Chronic Pain:continue narcotics-claims was on Oxycodone 30 mg prn at home--stable with new pain regimen.  Disposition: Remain inpatient  Antimicrobial agents  See below  Anti-infectives    Start     Dose/Rate Route Frequency Ordered Stop   04/08/15 1600  vancomycin (VANCOCIN) IVPB 1000 mg/200 mL premix     1,000 mg 200 mL/hr over 60 Minutes Intravenous Every 8 hours 04/08/15 1532     04/08/15 1600  ceFEPIme (MAXIPIME) 1 g in dextrose 5 % 50 mL IVPB     1 g 100 mL/hr over 30 Minutes Intravenous 3 times per day 04/08/15 1534     04/08/15 1544  ceFEPIme (MAXIPIME) 1 g injection    Comments:  [name]   : cabinet override      04/08/15 1544 04/09/15 0344      DVT Prophylaxis: Prophylactic Lovenox   Code Status: Full code   Family Communication None at bedside  Procedures: None  CONSULTS:  vascular surgery  Time spent 20 minutes-Greater than 50% of this time was spent in counseling, explanation of diagnosis, planning of further management, and coordination of care.  MEDICATIONS: Scheduled Meds: . ALPRAZolam  1 mg Oral TID  . aspirin EC  325 mg Oral Daily  . ceFEPime (MAXIPIME) IV  1 g Intravenous 3 times per day  . docusate sodium  100 mg Oral BID  . enoxaparin (LOVENOX) injection  40 mg Subcutaneous Q24H  . enoxaparin (LOVENOX) injection  40 mg Subcutaneous Q24H  . guaiFENesin  600 mg Oral BID  . nicotine  21 mg Transdermal Daily  . polyethylene glycol  17 g Oral Daily  . senna  1 tablet Oral BID  . sodium chloride flush  3 mL Intravenous Q12H  . traZODone  75 mg Oral QHS  . vancomycin  1,000 mg Intravenous Q8H  . zolpidem  10 mg Oral QHS   Continuous Infusions: . dextrose 5 % and 0.45 % NaCl with KCl 20 mEq/L 75 mL/hr at 04/11/15 2130   PRN Meds:.sodium chloride, acetaminophen **OR** acetaminophen, acetaminophen, albuterol, ALPRAZolam, bisacodyl, HYDROmorphone (DILAUDID) injection, ondansetron (ZOFRAN) IV, oxyCODONE, sodium chloride flush    PHYSICAL EXAM: Vital signs in last 24 hours: Filed Vitals:   04/11/15 1835 04/11/15 1859 04/11/15 2041 04/12/15 0532  BP: 132/74 129/90 132/84 132/80  Pulse:   83 102  Temp:   99.4 F (37.4 C) 98.6 F (37 C)  TempSrc:   Oral Oral  Resp:   18 16  Height:    6' 0.01" (1.829 m)  Weight:    73.936 kg (163 lb)  SpO2: 97% 98% 97% 98%    Weight change:  Filed Weights   04/08/15 1352 04/12/15 0532  Weight: 73.936 kg (163 lb) 73.936 kg (163 lb)   Body mass index is 22.1 kg/(m^2).   Gen Exam: Awake and alert with clear speech.   Neck: Supple, No JVD.   Chest: B/L Clear.  No rales CVS: S1 S2 Regular, no murmurs.  Abdomen: soft, BS +, non tender, non distended.  Extremities: Left foot-lower leg erythematous-but much improved, distal left foot is cold. The right lower extremities warm-no erythematous changes in the  right lower extremity Neurologic: Non Focal.   Skin: No Rash.   Wounds: N/A.   Intake/Output from previous day:  Intake/Output Summary (Last 24 hours) at 04/12/15 1144 Last data filed at 04/12/15 0845  Gross per 24 hour  Intake    240 ml  Output   1400 ml  Net  -1160 ml     LAB RESULTS: CBC  Recent Labs Lab 04/08/15 1440 04/09/15 0402  WBC 10.9* 10.6*  HGB 15.2 14.3  HCT 43.9 43.4  PLT 478* 432*  MCV 96.9 98.4  MCH 33.6 32.4  MCHC 34.6 32.9  RDW 12.5 12.9  LYMPHSABS 3.1  --   MONOABS 0.9  --   EOSABS 0.0  --   BASOSABS 0.0  --     Chemistries   Recent Labs Lab 04/08/15 1440 04/09/15 0402 04/12/15 0401  NA 135 139 135  K 3.8 3.7 4.6  CL 100* 103 97*  CO2 26 26 30   GLUCOSE 101* 105* 120*  BUN 14 9 6   CREATININE 0.70 0.73 0.77  CALCIUM 8.7* 8.9 8.7*  MG  --  1.9  --     CBG: No results for input(s): GLUCAP in the last 168 hours.  GFR Estimated Creatinine Clearance: 100.1 mL/min (by C-G formula based on Cr of 0.77).  Coagulation profile No results for input(s): INR, PROTIME in the last 168 hours.  Cardiac Enzymes No results for input(s): CKMB, TROPONINI, MYOGLOBIN in the last 168 hours.  Invalid input(s): CK  Invalid input(s): POCBNP No results for input(s): DDIMER in the last 72 hours.  Recent Labs  04/10/15 1528  HGBA1C 5.5    Recent Labs  04/11/15 0412  CHOL 96  HDL 29*  LDLCALC 43  TRIG 409  CHOLHDL 3.3   No results for input(s): TSH, T4TOTAL, T3FREE, THYROIDAB in the last 72 hours.  Invalid input(s): FREET3 No results for input(s): VITAMINB12, FOLATE, FERRITIN, TIBC, IRON, RETICCTPCT in the last 72 hours. No results for input(s): LIPASE, AMYLASE in the last 72 hours.  Urine Studies No results for input(s): UHGB, CRYS in the last 72 hours.  Invalid input(s): UACOL, UAPR, USPG, UPH, UTP, UGL, UKET, UBIL, UNIT, UROB, ULEU, UEPI, UWBC, URBC, UBAC, CAST, UCOM, BILUA  MICROBIOLOGY: No results found for this or any previous  visit (from the past 240 hour(s)).  RADIOLOGY STUDIES/RESULTS: Dg Chest 2 View  04/09/2015  CLINICAL DATA:  Shortness of breath.  COPD EXAM: CHEST  2 VIEW COMPARISON:  01/17/2010 FINDINGS: There is hyperinflation of the lungs compatible with COPD. Heart and mediastinal contours are within normal limits. No focal opacities or effusions. No acute bony abnormality. IMPRESSION: COPD.  No active disease. Electronically Signed   By: Charlett Nose M.D.   On: 04/09/2015 09:37   Dg Foot Complete Left  04/08/2015  CLINICAL DATA:  Left foot swelling and redness. No known injury. Duration of symptoms 3 weeks. EXAM: LEFT FOOT -  COMPLETE 3+ VIEW COMPARISON:  02/23/2011 FINDINGS: There is soft tissue deformity of the distal aspect of the second toe with erosion of the distal portion of the distal phalanx consistent with osteomyelitis. The other regional bones are osteopenic but there is no other sign of focal bone infection. IMPRESSION: Abnormal soft tissue appearance of the second toe with erosion of the distal portion of the distal phalanx consistent with osteomyelitis. Electronically Signed   By: Paulina Fusi M.D.   On: 04/08/2015 15:09    Jeoffrey Massed, MD  [facility] Pager:[phone]  If 7PM-7AM, please contact night-coverage [email or web address] Password [email or web address] 04/12/2015, 11:44 AM   LOS: 4 days

## 2015-04-12 NOTE — Progress Notes (Signed)
Patient ID: Joe Ballard, male   DOB: 26-Oct-1950, 65 y.o.   MRN: [id number] No new complaints. Continued dependent rubor on his left foot. Able to move his toes and since this. Did discuss the plan for left femoral to below-knee popliteal bypass with vein harvested from his right leg. Explained that he does have short segment occlusion of his right superficial femoral artery which is not causing any difficulty currently. Explain the only other option would be amputation. Should be able to be a salvageable situation since this tissue loss is not extensive in his left foot. For bypass tomorrow. I have explained the procedure on multiple occasions with the patient. He has very poor understanding of our plan. Continues to confuse where inflow and outflow and with the conduit is. Splane that we would be harvesting vein from his right leg with multiple small hard vein harvest incisions. Explained that he would have an incision in his left groin and left below-knee popliteal space. Explained that he would be discharged from the hospital once he is comfortable up walking again. He expresses understanding and wishes to proceed

## 2015-04-12 NOTE — Progress Notes (Signed)
Patient is refusing to take his lovenox. States he will not take anymore blood thinners. I explained the importance of them and he still refuses.  Minerva Ends

## 2015-04-13 ENCOUNTER — Encounter (HOSPITAL_COMMUNITY)
Admission: EM | Disposition: A | Discharge status: Home / Self Care | Payer: Self-pay | Source: Home / Self Care | Attending: Internal Medicine

## 2015-04-13 ENCOUNTER — Inpatient Hospital Stay (HOSPITAL_COMMUNITY): Payer: Medicaid Other | Admitting: Critical Care Medicine

## 2015-04-13 ENCOUNTER — Encounter (HOSPITAL_COMMUNITY): Payer: Self-pay | Admitting: Critical Care Medicine

## 2015-04-13 DIAGNOSIS — I998 Other disorder of circulatory system: Secondary | ICD-10-CM

## 2015-04-13 DIAGNOSIS — I739 Peripheral vascular disease, unspecified: Secondary | ICD-10-CM | POA: Diagnosis present

## 2015-04-13 HISTORY — PX: FEMORAL-POPLITEAL BYPASS GRAFT: SHX937

## 2015-04-13 HISTORY — PX: VEIN HARVEST: SHX6363

## 2015-04-13 LAB — BASIC METABOLIC PANEL
ANION GAP: 7 (ref 5–15)
BUN: 6 mg/dL (ref 6–20)
CHLORIDE: 96 mmol/L — AB (ref 101–111)
CO2: 30 mmol/L (ref 22–32)
Calcium: 8.6 mg/dL — ABNORMAL LOW (ref 8.9–10.3)
Creatinine, Ser: 0.89 mg/dL (ref 0.61–1.24)
Glucose, Bld: 121 mg/dL — ABNORMAL HIGH (ref 65–99)
POTASSIUM: 4.6 mmol/L (ref 3.5–5.1)
SODIUM: 133 mmol/L — AB (ref 135–145)

## 2015-04-13 LAB — CBC
HEMATOCRIT: 37.2 % — AB (ref 39.0–52.0)
HEMOGLOBIN: 12 g/dL — AB (ref 13.0–17.0)
MCH: 32.3 pg (ref 26.0–34.0)
MCHC: 32.3 g/dL (ref 30.0–36.0)
MCV: 100.3 fL — AB (ref 78.0–100.0)
Platelets: 350 10*3/uL (ref 150–400)
RBC: 3.71 MIL/uL — AB (ref 4.22–5.81)
RDW: 12.6 % (ref 11.5–15.5)
WBC: 8.9 10*3/uL (ref 4.0–10.5)

## 2015-04-13 LAB — SURGICAL PCR SCREEN
MRSA, PCR: NEGATIVE
STAPHYLOCOCCUS AUREUS: NEGATIVE

## 2015-04-13 LAB — PROTIME-INR
INR: 1.05 (ref 0.00–1.49)
Prothrombin Time: 13.9 seconds (ref 11.6–15.2)

## 2015-04-13 SURGERY — BYPASS GRAFT FEMORAL-POPLITEAL ARTERY
Anesthesia: General | Site: Leg Upper | Laterality: Right

## 2015-04-13 MED ORDER — ACETAMINOPHEN 650 MG RE SUPP: 325.0000 mg | RECTAL | Status: DC | PRN

## 2015-04-13 MED ORDER — ALUM & MAG HYDROXIDE-SIMETH 200-200-20 MG/5ML PO SUSP: 15.0000 mL | ORAL | Status: DC | PRN

## 2015-04-13 MED ORDER — ONDANSETRON HCL 4 MG/2ML IJ SOLN
INTRAMUSCULAR | Status: AC
  Filled 2015-04-13: qty 2

## 2015-04-13 MED ORDER — MEPERIDINE HCL 25 MG/ML IJ SOLN: 6.2500 mg | INTRAMUSCULAR | Status: DC | PRN

## 2015-04-13 MED ORDER — PROPOFOL 10 MG/ML IV BOLUS
INTRAVENOUS | Status: DC | PRN
  Administered 2015-04-13: 140 mg via INTRAVENOUS

## 2015-04-13 MED ORDER — METOPROLOL TARTRATE 1 MG/ML IV SOLN: 2.0000 mg | INTRAVENOUS | Status: DC | PRN

## 2015-04-13 MED ORDER — LIDOCAINE HCL (CARDIAC) 20 MG/ML IV SOLN
INTRAVENOUS | Status: AC
  Filled 2015-04-13: qty 5

## 2015-04-13 MED ORDER — ATROPINE SULFATE 0.1 MG/ML IJ SOLN
INTRAMUSCULAR | Status: AC
  Filled 2015-04-13: qty 10

## 2015-04-13 MED ORDER — NEOSTIGMINE METHYLSULFATE 10 MG/10ML IV SOLN
INTRAVENOUS | Status: DC | PRN
  Administered 2015-04-13: 3 mg via INTRAVENOUS

## 2015-04-13 MED ORDER — MIDAZOLAM HCL 5 MG/5ML IJ SOLN
INTRAMUSCULAR | Status: DC | PRN
  Administered 2015-04-13: 2 mg via INTRAVENOUS

## 2015-04-13 MED ORDER — FENTANYL CITRATE (PF) 100 MCG/2ML IJ SOLN
INTRAMUSCULAR | Status: DC | PRN
Start: 2015-04-13 — End: 2015-04-13
  Administered 2015-04-13 (×5): 50 ug via INTRAVENOUS

## 2015-04-13 MED ORDER — ONDANSETRON HCL 4 MG/2ML IJ SOLN: 4.0000 mg | Freq: Four times a day (QID) | INTRAMUSCULAR | Status: DC | PRN

## 2015-04-13 MED ORDER — SUGAMMADEX SODIUM 200 MG/2ML IV SOLN
INTRAVENOUS | Status: AC
  Filled 2015-04-13: qty 2

## 2015-04-13 MED ORDER — PROPOFOL 10 MG/ML IV BOLUS
INTRAVENOUS | Status: AC
  Filled 2015-04-13: qty 20

## 2015-04-13 MED ORDER — LIDOCAINE HCL (CARDIAC) 20 MG/ML IV SOLN
INTRAVENOUS | Status: DC | PRN
  Administered 2015-04-13: 100 mg via INTRAVENOUS

## 2015-04-13 MED ORDER — LACTATED RINGERS IV SOLN
INTRAVENOUS | Status: DC
Start: 2015-04-13 — End: 2015-04-13
  Administered 2015-04-13: 50 mL/h via INTRAVENOUS

## 2015-04-13 MED ORDER — ACETAMINOPHEN 325 MG PO TABS
325.0000 mg | ORAL_TABLET | ORAL | Status: DC | PRN
  Administered 2015-04-13 – 2015-04-14 (×2): 650 mg via ORAL
  Filled 2015-04-13 (×2): qty 2

## 2015-04-13 MED ORDER — SODIUM CHLORIDE 0.9 % IV SOLN
INTRAVENOUS | Status: DC
  Administered 2015-04-13 (×2): via INTRAVENOUS

## 2015-04-13 MED ORDER — SODIUM CHLORIDE 0.9 % IJ SOLN
INTRAMUSCULAR | Status: AC
  Filled 2015-04-13: qty 10

## 2015-04-13 MED ORDER — LACTATED RINGERS IV SOLN
INTRAVENOUS | Status: DC
Start: 2015-04-13 — End: 2015-04-13

## 2015-04-13 MED ORDER — FENTANYL CITRATE (PF) 250 MCG/5ML IJ SOLN
INTRAMUSCULAR | Status: AC
  Filled 2015-04-13: qty 5

## 2015-04-13 MED ORDER — HYDROMORPHONE HCL 1 MG/ML IJ SOLN
0.2500 mg | INTRAMUSCULAR | Status: DC | PRN
  Administered 2015-04-13 (×4): 0.5 mg via INTRAVENOUS

## 2015-04-13 MED ORDER — ONDANSETRON HCL 4 MG/2ML IJ SOLN
INTRAMUSCULAR | Status: DC | PRN
  Administered 2015-04-13: 4 mg via INTRAVENOUS

## 2015-04-13 MED ORDER — LACTATED RINGERS IV SOLN
INTRAVENOUS | Status: DC | PRN
  Administered 2015-04-13 (×2): via INTRAVENOUS

## 2015-04-13 MED ORDER — ROCURONIUM BROMIDE 100 MG/10ML IV SOLN
INTRAVENOUS | Status: DC | PRN
  Administered 2015-04-13: 50 mg via INTRAVENOUS

## 2015-04-13 MED ORDER — PROTAMINE SULFATE 10 MG/ML IV SOLN
INTRAVENOUS | Status: AC
  Filled 2015-04-13: qty 5

## 2015-04-13 MED ORDER — PROTAMINE SULFATE 10 MG/ML IV SOLN
INTRAVENOUS | Status: DC | PRN
  Administered 2015-04-13: 50 mg via INTRAVENOUS

## 2015-04-13 MED ORDER — PHENYLEPHRINE HCL 10 MG/ML IJ SOLN
10.0000 mg | INTRAVENOUS | Status: DC | PRN
  Administered 2015-04-13: 14:00:00 via INTRAVENOUS
  Administered 2015-04-13: 25 ug/min via INTRAVENOUS

## 2015-04-13 MED ORDER — ENOXAPARIN SODIUM 40 MG/0.4ML ~~LOC~~ SOLN
40.0000 mg | SUBCUTANEOUS | Status: DC
  Administered 2015-04-14 – 2015-04-16 (×3): 40 mg via SUBCUTANEOUS
  Filled 2015-04-13 (×3): qty 0.4

## 2015-04-13 MED ORDER — MIDAZOLAM HCL 2 MG/2ML IJ SOLN
INTRAMUSCULAR | Status: AC
  Filled 2015-04-13: qty 2

## 2015-04-13 MED ORDER — ONDANSETRON HCL 4 MG/2ML IJ SOLN: 4.0000 mg | Freq: Once | INTRAMUSCULAR | Status: DC | PRN

## 2015-04-13 MED ORDER — NEOSTIGMINE METHYLSULFATE 10 MG/10ML IV SOLN
INTRAVENOUS | Status: AC
  Filled 2015-04-13: qty 1

## 2015-04-13 MED ORDER — GUAIFENESIN-DM 100-10 MG/5ML PO SYRP: 15.0000 mL | ORAL_SOLUTION | ORAL | Status: DC | PRN

## 2015-04-13 MED ORDER — PHENOL 1.4 % MT LIQD: 1.0000 | OROMUCOSAL | Status: DC | PRN

## 2015-04-13 MED ORDER — LABETALOL HCL 5 MG/ML IV SOLN: 10.0000 mg | INTRAVENOUS | Status: DC | PRN

## 2015-04-13 MED ORDER — HYDROMORPHONE HCL 1 MG/ML IJ SOLN
INTRAMUSCULAR | Status: AC
  Filled 2015-04-13: qty 1

## 2015-04-13 MED ORDER — PANTOPRAZOLE SODIUM 40 MG PO TBEC
40.0000 mg | DELAYED_RELEASE_TABLET | Freq: Every day | ORAL | Status: DC
  Administered 2015-04-14 – 2015-04-16 (×3): 40 mg via ORAL
  Filled 2015-04-13 (×4): qty 1

## 2015-04-13 MED ORDER — GLYCOPYRROLATE 0.2 MG/ML IJ SOLN
INTRAMUSCULAR | Status: DC | PRN
  Administered 2015-04-13: 0.4 mg via INTRAVENOUS

## 2015-04-13 MED ORDER — POTASSIUM CHLORIDE CRYS ER 20 MEQ PO TBCR: 20.0000 meq | EXTENDED_RELEASE_TABLET | Freq: Every day | ORAL | Status: DC | PRN

## 2015-04-13 MED ORDER — 0.9 % SODIUM CHLORIDE (POUR BTL) OPTIME
TOPICAL | Status: DC | PRN
  Administered 2015-04-13: 1000 mL

## 2015-04-13 MED ORDER — HYDRALAZINE HCL 20 MG/ML IJ SOLN: 5.0000 mg | INTRAMUSCULAR | Status: DC | PRN

## 2015-04-13 MED ORDER — SODIUM CHLORIDE 0.9 % IV SOLN
INTRAVENOUS | Status: DC | PRN
  Administered 2015-04-13: 500 mL

## 2015-04-13 MED ORDER — VANCOMYCIN HCL IN DEXTROSE 1-5 GM/200ML-% IV SOLN
INTRAVENOUS | Status: AC
  Filled 2015-04-13: qty 200

## 2015-04-13 MED ORDER — HEPARIN SODIUM (PORCINE) 1000 UNIT/ML IJ SOLN
INTRAMUSCULAR | Status: DC | PRN
  Administered 2015-04-13: 7000 [IU] via INTRAVENOUS

## 2015-04-13 MED ORDER — GLYCOPYRROLATE 0.2 MG/ML IJ SOLN
INTRAMUSCULAR | Status: AC
  Filled 2015-04-13: qty 2

## 2015-04-13 MED ORDER — PHENYLEPHRINE HCL 10 MG/ML IJ SOLN
INTRAMUSCULAR | Status: DC | PRN
  Administered 2015-04-13: 120 ug via INTRAVENOUS

## 2015-04-13 MED ORDER — EPHEDRINE SULFATE 50 MG/ML IJ SOLN
INTRAMUSCULAR | Status: AC
  Filled 2015-04-13: qty 1

## 2015-04-13 MED ORDER — SODIUM CHLORIDE 0.9 % IV SOLN
500.0000 mL | Freq: Once | INTRAVENOUS | Status: AC | PRN
  Administered 2015-04-13: 500 mL via INTRAVENOUS

## 2015-04-13 MED ORDER — ASPIRIN EC 325 MG PO TBEC
325.0000 mg | DELAYED_RELEASE_TABLET | Freq: Every day | ORAL | Status: DC
  Administered 2015-04-14 – 2015-04-17 (×4): 325 mg via ORAL
  Filled 2015-04-13 (×4): qty 1

## 2015-04-13 SURGICAL SUPPLY — 67 items
APL SKNCLS STERI-STRIP NONHPOA (GAUZE/BANDAGES/DRESSINGS) ×4
BAG ISL DRAPE 18X18 STRL (DRAPES) ×2
BAG ISOLATION DRAPE 18X18 (DRAPES) ×1 IMPLANT
BANDAGE ESMARK 6X9 LF (GAUZE/BANDAGES/DRESSINGS) IMPLANT
BENZOIN TINCTURE PRP APPL 2/3 (GAUZE/BANDAGES/DRESSINGS) ×6 IMPLANT
BNDG CMPR 9X6 STRL LF SNTH (GAUZE/BANDAGES/DRESSINGS)
BNDG ESMARK 6X9 LF (GAUZE/BANDAGES/DRESSINGS)
CANISTER SUCTION 2500CC (MISCELLANEOUS) ×4 IMPLANT
CANNULA VESSEL 3MM 2 BLNT TIP (CANNULA) ×8 IMPLANT
CLIP LIGATING EXTRA MED SLVR (CLIP) ×4 IMPLANT
CLIP LIGATING EXTRA SM BLUE (MISCELLANEOUS) ×4 IMPLANT
CLOSURE WOUND 1/2 X4 (GAUZE/BANDAGES/DRESSINGS) ×3
COVER PROBE W GEL 5X96 (DRAPES) ×2 IMPLANT
CUFF TOURNIQUET SINGLE 34IN LL (TOURNIQUET CUFF) IMPLANT
CUFF TOURNIQUET SINGLE 44IN (TOURNIQUET CUFF) IMPLANT
DRAIN SNY 10X20 3/4 PERF (WOUND CARE) IMPLANT
DRAPE ISOLATION BAG 18X18 (DRAPES) ×2
DRAPE PROXIMA HALF (DRAPES) IMPLANT
DRAPE X-RAY CASS 24X20 (DRAPES) IMPLANT
DRSG COVADERM 4X10 (GAUZE/BANDAGES/DRESSINGS) ×2 IMPLANT
DRSG COVADERM 4X14 (GAUZE/BANDAGES/DRESSINGS) ×3 IMPLANT
DRSG COVADERM 4X6 (GAUZE/BANDAGES/DRESSINGS) ×2 IMPLANT
DRSG COVADERM 4X8 (GAUZE/BANDAGES/DRESSINGS) ×4 IMPLANT
ELECT REM PT RETURN 9FT ADLT (ELECTROSURGICAL) ×4
ELECTRODE REM PT RTRN 9FT ADLT (ELECTROSURGICAL) ×2 IMPLANT
EVACUATOR SILICONE 100CC (DRAIN) IMPLANT
GAUZE SPONGE 4X4 12PLY STRL (GAUZE/BANDAGES/DRESSINGS) ×4 IMPLANT
GLOVE BIO SURGEON STRL SZ 6.5 (GLOVE) ×2 IMPLANT
GLOVE BIO SURGEONS STRL SZ 6.5 (GLOVE) ×2
GLOVE BIOGEL PI IND STRL 6.5 (GLOVE) IMPLANT
GLOVE BIOGEL PI IND STRL 7.5 (GLOVE) ×2 IMPLANT
GLOVE BIOGEL PI INDICATOR 6.5 (GLOVE) ×4
GLOVE BIOGEL PI INDICATOR 7.5 (GLOVE) ×4
GLOVE ECLIPSE 6.5 STRL STRAW (GLOVE) ×3 IMPLANT
GLOVE SS BIOGEL STRL SZ 7.5 (GLOVE) ×2 IMPLANT
GLOVE SUPERSENSE BIOGEL SZ 7.5 (GLOVE) ×2
GOWN STRL REUS W/ TWL LRG LVL3 (GOWN DISPOSABLE) ×6 IMPLANT
GOWN STRL REUS W/ TWL XL LVL3 (GOWN DISPOSABLE) IMPLANT
GOWN STRL REUS W/TWL LRG LVL3 (GOWN DISPOSABLE) ×16
GOWN STRL REUS W/TWL XL LVL3 (GOWN DISPOSABLE) ×4
INSERT FOGARTY SM (MISCELLANEOUS) IMPLANT
KIT BASIN OR (CUSTOM PROCEDURE TRAY) ×4 IMPLANT
KIT ROOM TURNOVER OR (KITS) ×4 IMPLANT
NS IRRIG 1000ML POUR BTL (IV SOLUTION) ×8 IMPLANT
PACK PERIPHERAL VASCULAR (CUSTOM PROCEDURE TRAY) ×4 IMPLANT
PAD ARMBOARD 7.5X6 YLW CONV (MISCELLANEOUS) ×8 IMPLANT
PADDING CAST COTTON 6X4 STRL (CAST SUPPLIES) IMPLANT
SET COLLECT BLD 21X3/4 12 (NEEDLE) IMPLANT
STAPLER VISISTAT 35W (STAPLE) IMPLANT
STOPCOCK 4 WAY LG BORE MALE ST (IV SETS) IMPLANT
STRIP CLOSURE SKIN 1/2X4 (GAUZE/BANDAGES/DRESSINGS) ×5 IMPLANT
SUT ETHILON 3 0 PS 1 (SUTURE) IMPLANT
SUT PROLENE 5 0 C 1 24 (SUTURE) ×4 IMPLANT
SUT PROLENE 6 0 CC (SUTURE) ×10 IMPLANT
SUT SILK 2 0 SH (SUTURE) ×4 IMPLANT
SUT SILK 3 0 (SUTURE) ×4
SUT SILK 3-0 18XBRD TIE 12 (SUTURE) IMPLANT
SUT SILK 4 0 (SUTURE) ×4
SUT SILK 4-0 18XBRD TIE 12 (SUTURE) IMPLANT
SUT VIC AB 2-0 CTX 36 (SUTURE) ×8 IMPLANT
SUT VIC AB 3-0 SH 27 (SUTURE) ×28
SUT VIC AB 3-0 SH 27X BRD (SUTURE) ×4 IMPLANT
SUT VICRYL 4-0 PS2 18IN ABS (SUTURE) ×4 IMPLANT
TRAY FOLEY W/METER SILVER 16FR (SET/KITS/TRAYS/PACK) ×4 IMPLANT
TUBING EXTENTION W/L.L. (IV SETS) IMPLANT
UNDERPAD 30X30 INCONTINENT (UNDERPADS AND DIAPERS) ×4 IMPLANT
WATER STERILE IRR 1000ML POUR (IV SOLUTION) ×4 IMPLANT

## 2015-04-13 NOTE — Anesthesia Postprocedure Evaluation (Signed)
Anesthesia Post Note  Patient: Joe Ballard  Procedure(s) Performed: Procedure(s) (LRB): LEFT FEMORAL- BELOW KNEE POPLITEAL ARTERY BYPASS GRAFT (Left) RIGHT SAPHENOUS VEIN HARVEST (Right)  Patient location during evaluation: PACU Anesthesia Type: General Level of consciousness: awake and alert Pain management: pain level controlled Vital Signs Assessment: post-procedure vital signs reviewed and stable Respiratory status: spontaneous breathing, nonlabored ventilation, respiratory function stable and patient connected to nasal cannula oxygen Cardiovascular status: blood pressure returned to baseline and stable Postop Assessment: no signs of nausea or vomiting Anesthetic complications: no    Last Vitals:  Filed Vitals:   04/13/15 0505 04/13/15 1520  BP: 110/77   Pulse: 104   Temp: 37.2 C 36.6 C  Resp: 18     Last Pain:  Filed Vitals:   04/13/15 1541  PainSc: 9                  Lawarence Meek DAVID

## 2015-04-13 NOTE — Transfer of Care (Signed)
Immediate Anesthesia Transfer of Care Note  Patient: Joe Ballard  Procedure(s) Performed: Procedure(s): LEFT FEMORAL- BELOW KNEE POPLITEAL ARTERY BYPASS GRAFT (Left) RIGHT SAPHENOUS VEIN HARVEST (Right)  Patient Location: PACU  Anesthesia Type:General  Level of Consciousness: awake, alert , oriented and patient cooperative  Airway & Oxygen Therapy: Patient Spontanous Breathing and Patient connected to nasal cannula oxygen  Post-op Assessment: Report given to RN and Post -op Vital signs reviewed and stable  Post vital signs: Reviewed  Last Vitals:  Filed Vitals:   04/13/15 0505 04/13/15 1520  BP: 110/77   Pulse: 104   Temp: 37.2 C 36.6 C  Resp: 18     Complications: No apparent anesthesia complications

## 2015-04-13 NOTE — Interval H&P Note (Signed)
History and Physical Interval Note:  04/13/2015 10:49 AM  Joe Ballard  has presented today for surgery, with the diagnosis of Left foot cellulitis L03.119  The various methods of treatment have been discussed with the patient and family. After consideration of risks, benefits and other options for treatment, the patient has consented to  Procedure(s): BYPASS GRAFT FEMORAL-POPLITEAL ARTERY (Left) VEIN HARVEST (Right) as a surgical intervention .  The patient's history has been reviewed, patient examined, no change in status, stable for surgery.  I have reviewed the patient's chart and labs.  Questions were answered to the patient's satisfaction.     Gretta Began

## 2015-04-13 NOTE — Anesthesia Preprocedure Evaluation (Addendum)
Anesthesia Evaluation  Patient identified by MRN, date of birth, ID band Patient awake    Reviewed: Allergy & Precautions, NPO status , Patient's Chart, lab work & pertinent test results  Airway Mallampati: II  TM Distance: >3 FB Neck ROM: Full    Dental  (+) Dental Advisory Given, Edentulous Upper, Edentulous Lower   Pulmonary COPD, Current Smoker,    Pulmonary exam normal        Cardiovascular + Peripheral Vascular Disease and +CHF  Normal cardiovascular exam     Neuro/Psych Anxiety  Neuromuscular disease    GI/Hepatic   Endo/Other    Renal/GU      Musculoskeletal  (+) Arthritis ,   Abdominal   Peds  Hematology   Anesthesia Other Findings   Reproductive/Obstetrics                            Anesthesia Physical Anesthesia Plan  ASA: III  Anesthesia Plan: General   Post-op Pain Management:    Induction: Intravenous  Airway Management Planned: Oral ETT  Additional Equipment:   Intra-op Plan:   Post-operative Plan: Extubation in OR  Informed Consent: I have reviewed the patients History and Physical, chart, labs and discussed the procedure including the risks, benefits and alternatives for the proposed anesthesia with the patient or authorized representative who has indicated his/her understanding and acceptance.   Dental advisory given  Plan Discussed with: Surgeon and CRNA  Anesthesia Plan Comments:        Anesthesia Quick Evaluation

## 2015-04-13 NOTE — Progress Notes (Signed)
PATIENT DETAILS Name: Joe Ballard Age: 65 y.o. Sex: male Date of Birth: 1950-02-22 Admit Date: 04/08/2015 Admitting Physician Therisa Doyne, MD ZOX:[id number],UJWJ C, [name]  Subjective: Left leg unchanged. Inquiring as to how much of narcotic medications will I give him on discharge.  Assessment/Plan: Active Problems: Left second toe osteomyelitis: Continue empiric cefepime/vancomycin. Spoke with Dr. early-recommended to continue IV anti-await area to demarcate post bypass/revascularization before proceeding with amputation.   Left ischemic foot: evaluated by vascular surgery, spoke with Dr. Arbie Cookey 2/25-no role for anticoagulation in this setting, underwent angiogram on 2/27 which demonstrated superficial femoral artery occlusion, vascular surgery planning on bypass today.  LDL 43, A1c 8.5, continue aspirin.  History of peripheral vascular disease: Unfortunately noncompliant with antiplatelets, continues to smoke. Will continue to counsel.  Anxiety:Continue Xanax  Tobacco abuse: Counseled  Chronic Pain:continue narcotics-claims was on Oxycodone 30 mg prn at home--stable with new pain regimen.  Disposition: Remain inpatient  Antimicrobial agents  See below  Anti-infectives    Start     Dose/Rate Route Frequency Ordered Stop   04/13/15 1000  vancomycin (VANCOCIN) IVPB 1000 mg/200 mL premix     1,000 mg 200 mL/hr over 60 Minutes Intravenous To [facility]Stay [facility] 04/12/15 1852 04/13/15 1230   04/08/15 1600  [MAR Hold]  vancomycin (VANCOCIN) IVPB 1000 mg/200 mL premix     (MAR Hold since 04/13/15 1005)   1,000 mg 200 mL/hr over 60 Minutes Intravenous Every 8 hours 04/08/15 1532     04/08/15 1600  [MAR Hold]  ceFEPIme (MAXIPIME) 1 g in dextrose 5 % 50 mL IVPB     (MAR Hold since 04/13/15 1005)   1 g 100 mL/hr over 30 Minutes Intravenous 3 times per day 04/08/15 1534     04/08/15 1544  ceFEPIme (MAXIPIME) 1 g injection    Comments:  [name]   : cabinet  override      04/08/15 1544 04/09/15 0344      DVT Prophylaxis: Prophylactic Lovenox   Code Status: Full code   Family Communication None at bedside  Procedures: None  CONSULTS:  vascular surgery  Time spent 20 minutes-Greater than 50% of this time was spent in counseling, explanation of diagnosis, planning of further management, and coordination of care.  MEDICATIONS: Scheduled Meds: . [MAR Hold] ALPRAZolam  1 mg Oral TID  . [MAR Hold] aspirin EC  325 mg Oral Daily  . [MAR Hold] ceFEPime (MAXIPIME) IV  1 g Intravenous 3 times per day  . [MAR Hold] docusate sodium  100 mg Oral BID  . [MAR Hold] enoxaparin (LOVENOX) injection  40 mg Subcutaneous Q24H  . [MAR Hold] enoxaparin (LOVENOX) injection  40 mg Subcutaneous Q24H  . [MAR Hold] guaiFENesin  600 mg Oral BID  . [MAR Hold] nicotine  21 mg Transdermal Daily  . [MAR Hold] polyethylene glycol  17 g Oral Daily  . [MAR Hold] senna  1 tablet Oral BID  . [MAR Hold] sodium chloride flush  3 mL Intravenous Q12H  . [MAR Hold] traZODone  75 mg Oral QHS  . [MAR Hold] vancomycin  1,000 mg Intravenous Q8H  . [MAR Hold] zolpidem  10 mg Oral QHS   Continuous Infusions: . dextrose 5 % and 0.45 % NaCl with KCl 20 mEq/L 75 mL/hr at 04/13/15 0432  . lactated ringers 50 mL/hr (04/13/15 1050)  . lactated ringers     PRN Meds:.[MAR Hold] sodium chloride, 0.9 %  irrigation (POUR BTL), [MAR Hold] acetaminophen **OR** [MAR Hold] acetaminophen, [MAR Hold] acetaminophen, [MAR Hold] albuterol, [MAR Hold] ALPRAZolam, [MAR Hold] bisacodyl, heparin 6000 unit irrigation, [MAR Hold]  HYDROmorphone (DILAUDID) injection, [MAR Hold] ondansetron (ZOFRAN) IV, [MAR Hold] oxyCODONE, [MAR Hold] sodium chloride flush    PHYSICAL EXAM: Vital signs in last 24 hours: Filed Vitals:   04/12/15 0532 04/12/15 1211 04/12/15 2016 04/13/15 0505  BP: 132/80 110/57 121/64 110/77  Pulse: 102 89 97 104  Temp: 98.6 F (37 C) 99.8 F (37.7 C) 99.6 F (37.6 C) 98.9 F  (37.2 C)  TempSrc: Oral Oral Oral Oral  Resp: Height: 6' 0.01" (1.829 m)     Weight: 73.936 kg (163 lb)   69.6 kg (153 lb 7 oz)  SpO2: 98% 96% 97% 100%    Weight change: -4.336 kg (-9 lb 9 oz) Filed Weights   04/08/15 1352 04/12/15 0532 04/13/15 0505  Weight: 73.936 kg (163 lb) 73.936 kg (163 lb) 69.6 kg (153 lb 7 oz)   Body mass index is 20.81 kg/(m^2).   Gen Exam: Awake and alert with clear speech.   Neck: Supple, No JVD.   Chest: B/L Clear.  No rales CVS: S1 S2 Regular, no murmurs.  Abdomen: soft, BS +, non tender, non distended.  Extremities: Left foot-lower leg erythematous-but much improved, distal left foot is cold. The right lower extremities warm-no erythematous changes in the right lower extremity Neurologic: Non Focal.   Skin: No Rash.   Wounds: N/A.   Intake/Output from previous day:  Intake/Output Summary (Last 24 hours) at 04/13/15 1424 Last data filed at 04/13/15 1421  Gross per 24 hour  Intake   1240 ml  Output    750 ml  Net    490 ml     LAB RESULTS: CBC  Recent Labs Lab 04/08/15 1440 04/09/15 0402 04/13/15 0245  WBC 10.9* 10.6* 8.9  HGB 15.2 14.3 12.0*  HCT 43.9 43.4 37.2*  PLT 478* 432* 350  MCV 96.9 98.4 100.3*  MCH 33.6 32.4 32.3  MCHC 34.6 32.9 32.3  RDW 12.5 12.9 12.6  LYMPHSABS 3.1  --   --   MONOABS 0.9  --   --   EOSABS 0.0  --   --   BASOSABS 0.0  --   --     Chemistries   Recent Labs Lab 04/08/15 1440 04/09/15 0402 04/12/15 0401 04/13/15 0245  NA 135 139 135 133*  K 3.8 3.7 4.6 4.6  CL 100* 103 97* 96*  CO2 GLUCOSE 101* 105* 120* 121*  BUN CREATININE 0.70 0.73 0.77 0.89  CALCIUM 8.7* 8.9 8.7* 8.6*  MG  --  1.9  --   --     CBG: No results for input(s): GLUCAP in the last 168 hours.  GFR Estimated Creatinine Clearance: 84.7 mL/min (by C-G formula based on Cr of 0.89).  Coagulation profile  Recent Labs Lab 04/13/15 0245  INR 1.05    Cardiac Enzymes No results for  input(s): CKMB, TROPONINI, MYOGLOBIN in the last 168 hours.  Invalid input(s): CK  Invalid input(s): POCBNP No results for input(s): DDIMER in the last 72 hours.  Recent Labs  04/10/15 1528  HGBA1C 5.5    Recent Labs  04/11/15 0412  CHOL 96  HDL 29*  LDLCALC 43  TRIG 161  CHOLHDL 3.3   No results for input(s): TSH, T4TOTAL, T3FREE, THYROIDAB in the last 72  hours.  Invalid input(s): FREET3 No results for input(s): VITAMINB12, FOLATE, FERRITIN, TIBC, IRON, RETICCTPCT in the last 72 hours. No results for input(s): LIPASE, AMYLASE in the last 72 hours.  Urine Studies No results for input(s): UHGB, CRYS in the last 72 hours.  Invalid input(s): UACOL, UAPR, USPG, UPH, UTP, UGL, UKET, UBIL, UNIT, UROB, ULEU, UEPI, UWBC, URBC, UBAC, CAST, UCOM, BILUA  MICROBIOLOGY: Recent Results (from the past 240 hour(s))  Surgical pcr screen     Status: None   Collection Time: 04/12/15 10:36 PM  Result Value Ref Range Status   MRSA, PCR NEGATIVE NEGATIVE Final   Staphylococcus aureus NEGATIVE NEGATIVE Final    Comment:        The Xpert SA Assay (FDA approved for NASAL specimens in patients over 44 years of age), is one component of a comprehensive surveillance program.  Test performance has been validated by [facility] for patients greater than or equal to 29 year old. It is not intended to diagnose infection nor to guide or monitor treatment.     RADIOLOGY STUDIES/RESULTS: Dg Chest 2 View  04/09/2015  CLINICAL DATA:  Shortness of breath.  COPD EXAM: CHEST  2 VIEW COMPARISON:  01/17/2010 FINDINGS: There is hyperinflation of the lungs compatible with COPD. Heart and mediastinal contours are within normal limits. No focal opacities or effusions. No acute bony abnormality. IMPRESSION: COPD.  No active disease. Electronically Signed   By: Charlett Nose M.D.   On: 04/09/2015 09:37   Dg Foot Complete Left  04/08/2015  CLINICAL DATA:  Left foot swelling and redness. No known injury.  Duration of symptoms 3 weeks. EXAM: LEFT FOOT - COMPLETE 3+ VIEW COMPARISON:  02/23/2011 FINDINGS: There is soft tissue deformity of the distal aspect of the second toe with erosion of the distal portion of the distal phalanx consistent with osteomyelitis. The other regional bones are osteopenic but there is no other sign of focal bone infection. IMPRESSION: Abnormal soft tissue appearance of the second toe with erosion of the distal portion of the distal phalanx consistent with osteomyelitis. Electronically Signed   By: Paulina Fusi M.D.   On: 04/08/2015 15:09    Jeoffrey Massed, MD  [facility] Pager:[phone]  If 7PM-7AM, please contact night-coverage [email or web address] Password [email or web address] 04/13/2015, 2:24 PM   LOS: 5 days

## 2015-04-13 NOTE — Progress Notes (Signed)
      Patient alert and oriented Incisions soft Doppler DP/PT bilaterally   S/P PROCEDURE: Left femoral to below-knee popliteal bypass with translocated non-reversed great saphenous vein harvested from right leg  COLLINS, EMMA MAUREEN [name]

## 2015-04-13 NOTE — H&P (View-Only) (Signed)
Patient ID: Joe Ballard, male   DOB: 06/20/1950, 64 y.o.   MRN: [id number] No new complaints. Continued dependent rubor on his left foot. Able to move his toes and since this. Did discuss the plan for left femoral to below-knee popliteal bypass with vein harvested from his right leg. Explained that he does have short segment occlusion of his right superficial femoral artery which is not causing any difficulty currently. Explain the only other option would be amputation. Should be able to be a salvageable situation since this tissue loss is not extensive in his left foot. For bypass tomorrow. I have explained the procedure on multiple occasions with the patient. He has very poor understanding of our plan. Continues to confuse where inflow and outflow and with the conduit is. Splane that we would be harvesting vein from his right leg with multiple small hard vein harvest incisions. Explained that he would have an incision in his left groin and left below-knee popliteal space. Explained that he would be discharged from the hospital once he is comfortable up walking again. He expresses understanding and wishes to proceed 

## 2015-04-13 NOTE — Op Note (Signed)
OPERATIVE REPORT  DATE OF SURGERY: 04/13/2015  PATIENT: Joe Ballard, 65 y.o. male MRN: [id number]  DOB: 1950/12/30  PRE-OPERATIVE DIAGNOSIS: Critical ischemia left lower extremity  POST-OPERATIVE DIAGNOSIS:  Same  PROCEDURE: Left femoral to below-knee popliteal bypass with translocated non-reversed great saphenous vein harvested from right leg  SURGEON:  Gretta Began, M.D.  PHYSICIAN ASSISTANT: Samantha Rhyne [name]  ANESTHESIA:  Gen.  EBL: Less than 100 ml  Total I/O In: 1300 [I.V.:1300] Out: 750 [Urine:700; Blood:50]  BLOOD ADMINISTERED: None  DRAINS: None  SPECIMEN: None  COUNTS CORRECT:  YES  PLAN OF CARE: PACU   PATIENT DISPOSITION:  PACU - hemodynamically stable  PROCEDURE DETAILS: Patient had prior revascularization on his left leg. He initially had undergone angioplasty and stenting by Dr. Myra Gianotti. He had reocclusion of the superficial femoral artery and was treated at [facility] approximately 18 months ago with a left femoral to below-knee popliteal bypass. He presented several days ago with worsening ischemia. He reports that he dropped a television on his foot and had the pain and increasingly increasing redness over his foot. He was admitted with cellulitis underwent arteriography revealing occluded femoropopliteal but reasonable runoff from the below-knee popliteal. He had disease in the peroneal and anterior tibial artery but good flow into the posterior tibial to the ankle into the foot. He is taking this operating room at this time for left femoral to below-knee popliteal bypass with saphenous vein harvested from his right leg  Right left groins and legs were prepped and draped in usual sterile fashion. SonoSite ultrasound was used to visualize the saphenous vein and this was marked on the skin. The saphenous vein was harvested from the right leg from the groin to the mid calf using several skin bridges. The vein was of very good caliber throughout its  course. Tributary branches were ligated with 3 or 4 silk ties and divided. The vein was ligated at the saphenofemoral junction with a 2-0 silk ties and divided and distally as well and divided. The vein was gently dilated with heparinized saline and was of excellent caliber. Marked to prevent twisting the tunnel. Next a separate incision was made over the left femoral pulse. The common femoral artery was exposed at the inguinal ligament and had the moderate posterior plaque but was widely patent for inflow. Separate incision was made through the prior scar in the medial approach to the below-knee popliteal artery. The old prior femoropopliteal anastomosis was exposed. The popliteal artery was exposed further distally this down to the takeoff of the anterior tibial artery. A tunnel was created from the level of the below-knee popliteal artery to the groin. The patient was given 7000 units intravenous heparin. After adequate circulation time the common femoral artery was occluded proximal and distally and opened 11 blade some loss anemia with Potts scissors. The vein was brought onto the field and was spatulated and sewn end-to-side to the common femoral artery with a running 6-0 Prolene suture. Anastomosis was tested and found to be adequate. The vein valves were lysed with the [employer] valvulotome this gave excellent flow through the vein graft. The vein was brought through prior created tunnel down to the level of the below-knee popliteal artery. The below-knee popliteal artery was occluded proximally and distally with Serafin clamps. The artery was opened with an 11 blade and sent legitimate Potts scissors. The artery was gently dilated with small right ankle. The vein was cut to appropriate length and was spatulated and sewn end-to-side to  the below-knee popliteal artery with a running 6-0 Prolene suture. Prior to completion of the closure 3 dilator passed easily through the distal anastomosis into the vein. The  anastomosis was completed and clamps removed. There was excellent graft dependent Doppler flow in the below-knee popliteal artery and the posterior tibial artery at the ankle. The patient was given 50 mg of protamine to reverse heparin. Wounds irrigated with saline. Hemostasis was obtained left cautery. Wounds were closed with 2-0 Vicryl and the left groin and left popliteal space and the fascia. The subcutaneous and subcuticular tissue were closed with 304 0 Vicryl sutures in running fashion. Sterile dressing was applied the patient was transferred to the recovery room stable condition   Gretta Began, M.D. 04/13/2015 3:06 PM

## 2015-04-13 NOTE — Anesthesia Procedure Notes (Signed)
Procedure Name: Intubation Date/Time: 04/13/2015 11:28 AM Performed by: Glo Herring B Pre-anesthesia Checklist: Patient identified, Emergency Drugs available, Suction available, Patient being monitored and Timeout performed Patient Re-evaluated:Patient Re-evaluated prior to inductionOxygen Delivery Method: Circle system utilized Preoxygenation: Pre-oxygenation with 100% oxygen Intubation Type: IV induction Ventilation: Mask ventilation with difficulty Laryngoscope Size: Mac and 4 Grade View: Grade II Tube type: Oral Tube size: 7.5 mm Number of attempts: 1 Airway Equipment and Method: Stylet Placement Confirmation: CO2 detector,  positive ETCO2,  ETT inserted through vocal cords under direct vision and breath sounds checked- equal and bilateral Secured at: 22 cm Tube secured with: Tape Dental Injury: Teeth and Oropharynx as per pre-operative assessment

## 2015-04-13 NOTE — OR Nursing (Signed)
Pts belongings were taken to patients room. Pt had earrings put back in, glasses were given to patient and cell phone was given to patient. Pt wanted to keep his cell phone with him on the way to 3 south room.

## 2015-04-14 ENCOUNTER — Inpatient Hospital Stay (HOSPITAL_COMMUNITY): Payer: Medicaid Other

## 2015-04-14 ENCOUNTER — Encounter (HOSPITAL_COMMUNITY): Payer: Self-pay | Admitting: Vascular Surgery

## 2015-04-14 ENCOUNTER — Telehealth: Payer: Self-pay | Admitting: Vascular Surgery

## 2015-04-14 LAB — CBC
HEMATOCRIT: 39.5 % (ref 39.0–52.0)
HEMOGLOBIN: 12.7 g/dL — AB (ref 13.0–17.0)
MCH: 32.2 pg (ref 26.0–34.0)
MCHC: 32.2 g/dL (ref 30.0–36.0)
MCV: 100.3 fL — AB (ref 78.0–100.0)
Platelets: 329 10*3/uL (ref 150–400)
RBC: 3.94 MIL/uL — AB (ref 4.22–5.81)
RDW: 12.4 % (ref 11.5–15.5)
WBC: 9.6 10*3/uL (ref 4.0–10.5)

## 2015-04-14 LAB — BASIC METABOLIC PANEL
ANION GAP: 5 (ref 5–15)
BUN: 7 mg/dL (ref 6–20)
CHLORIDE: 97 mmol/L — AB (ref 101–111)
CO2: 33 mmol/L — AB (ref 22–32)
Calcium: 8.7 mg/dL — ABNORMAL LOW (ref 8.9–10.3)
Creatinine, Ser: 0.86 mg/dL (ref 0.61–1.24)
GFR calc non Af Amer: >60 mL/min (ref >60)
GLUCOSE: 83 mg/dL (ref 65–99)
POTASSIUM: 4.8 mmol/L (ref 3.5–5.1)
Sodium: 135 mmol/L (ref 135–145)

## 2015-04-14 LAB — GLUCOSE, CAPILLARY: Glucose-Capillary: 121 mg/dL — ABNORMAL HIGH (ref 65–99)

## 2015-04-14 NOTE — Progress Notes (Signed)
Pt refuses to get up at this time says he will get up of later
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
## 2015-04-14 NOTE — Care Management Note (Signed)
Case Management Note  Patient Details  Name: Joe Ballard MRN: [id number] Date of Birth: 02-22-1950  Subjective/Objective:     Patient lives alone in [place], s/p Fem Pop, uses a cane, he has [employer], he can get his medications with out any problems.  He states he has transportation.  Per pt eval rec hhpt, patient only has Medicaid insurance which means hhpt will not be covered.  Patient can get a HHRN , but he states he will need to think about it , NCM left the agency list with patient.  Patient is also being followed by Medicaid Case Manager, she gave him a list of Doctors for his pcp to choose from and he states he thinks he want to go to [facility] [address] in [place] [address].  NCM will continue to follow for dc needs.               Action/Plan:   Expected Discharge Date:  04/12/15               Expected Discharge Plan:  Home w Home Health Services  In-House Referral:     Discharge planning Services  CM Consult  Post Acute Care Choice:    Choice offered to:     DME Arranged:    DME Agency:     HH Arranged:    HH Agency:     Status of Service:  In process, will continue to follow  Medicare Important Message Given:    Date Medicare IM Given:    Medicare IM give by:    Date Additional Medicare IM Given:    Additional Medicare Important Message give by:     If discussed at Long Length of Stay Meetings, dates discussed:    Additional Comments:  Leone Haven, RN 04/14/2015, 1:49 PM

## 2015-04-14 NOTE — Telephone Encounter (Signed)
-----   Message from Phillips Odor, RN sent at 04/14/2015  9:52 AM EST ----- Regarding: needs 2 wk. f/u with [facility]   ----- Message -----    From: Dara Lords, [name]    Sent: 04/14/2015   7:41 AM      To: [facility] Charge Pool  S/p left fem pop.  F/u with [facility] in 2 weeks.

## 2015-04-14 NOTE — Progress Notes (Signed)
IV site Left forearm discontinued, pt complains with hurting, unable to flush.   Patient refusing to ambulate at this time, pulse dopplered in Left foot great.  Governor Specking, RN

## 2015-04-14 NOTE — Progress Notes (Signed)
Spoke with Dr Early-[facility] will assume primary service. Hospitalist service will sign off.

## 2015-04-14 NOTE — Progress Notes (Deleted)
Received patient in room 2W33  From Unit 2 Heart, alert & oriented, vss, no complaints of pain, placed on Telemetry  NSR.   Governor Specking, RN

## 2015-04-14 NOTE — Telephone Encounter (Signed)
LM for pt with appt date/time, dpm °

## 2015-04-14 NOTE — Progress Notes (Addendum)
  Progress Note    04/14/2015 7:23 AM 1 Day Post-Op  Subjective:  C/o pain in his left foot  Tm 101.6  HR  90's-100's NSR 90's-140's systolic 93% 3LO2NC  Filed Vitals:   04/13/15 2252 04/14/15 0426  BP: 99/54 119/54  Pulse: 92 106  Temp: 98.9 F (37.2 C) 101.6 F (38.7 C)  Resp: 10 20    Physical Exam: Cardiac:  regular Lungs:  Non labored Incisions:  Bilateral groin incisions and both lower leg incisions are clean and dry with steri strips in tact Extremities:  Brisk doppler flow in left DP/PT less so in the peroneal.  Brisk doppler flow right PT   CBC    Component Value Date/Time   WBC 9.6 04/14/2015 0347   WBC 7.2 09/07/2005 1048   RBC 3.94* 04/14/2015 0347   RBC 4.69 09/07/2005 1048   HGB 12.7* 04/14/2015 0347   HGB 16.1 09/07/2005 1048   HCT 39.5 04/14/2015 0347   HCT 46.1 09/07/2005 1048   PLT 329 04/14/2015 0347   PLT 298 09/07/2005 1048   MCV 100.3* 04/14/2015 0347   MCV 98.4* 09/07/2005 1048   MCH 32.2 04/14/2015 0347   MCH 34.4* 09/07/2005 1048   MCHC 32.2 04/14/2015 0347   MCHC 35.0 09/07/2005 1048   RDW 12.4 04/14/2015 0347   RDW 13.9 09/07/2005 1048   LYMPHSABS 3.1 04/08/2015 1440   LYMPHSABS 2.7 09/07/2005 1048   MONOABS 0.9 04/08/2015 1440   MONOABS 0.6 09/07/2005 1048   EOSABS 0.0 04/08/2015 1440   EOSABS 0.0 09/07/2005 1048   BASOSABS 0.0 04/08/2015 1440   BASOSABS 0.0 09/07/2005 1048    BMET    Component Value Date/Time   NA 135 04/14/2015 0347   K 4.8 04/14/2015 0347   CL 97* 04/14/2015 0347   CO2 33* 04/14/2015 0347   GLUCOSE 83 04/14/2015 0347   BUN 7 04/14/2015 0347   CREATININE 0.86 04/14/2015 0347   CALCIUM 8.7* 04/14/2015 0347   GFRNONAA >60 04/14/2015 0347   GFRAA >60 04/14/2015 0347    INR    Component Value Date/Time   INR 1.05 04/13/2015 0245     Intake/Output Summary (Last 24 hours) at 04/14/15 0723 Last data filed at 04/14/15 0600  Gross per 24 hour  Intake 2282.5 ml  Output   2350 ml  Net  -67.5 ml      Assessment:  65 y.o. male is s/p:  Left femoral to below-knee popliteal bypass with translocated non-reversed great saphenous vein harvested from right leg  1 Day Post-Op  Plan: -pt with brisk DP/PT > peroneal doppler signals left foot -foley catheter out earlier this morning -tm 101.6-most likely atelectasis-needs to mobilize and use IS every hour -OOB to chair-refused this morning.  Hopefully he will work with PT today -continue ABx for now for osteomyelitis-hopefully will also improve with better blood flow to the foot. -discussed importance of not having any pressure from the bed on the left foot and floating the heel and leg elevation.   -DVT prophylaxis:  Lovenox -transfer to 2 west   Doreatha Massed, [facility] [phone] 04/14/2015 7:23 AM    I have examined the patient, reviewed and agree with above. Foot looks much better today. There is a skin loss demarcating. 2-3+ palpable posterior tibial pulse. Groin and popliteal incisions healing without hematoma. Transfer to 2 west. Mobilize. Home when comfortable walking.  Gretta Began, MD 04/14/2015 9:21 AM

## 2015-04-15 ENCOUNTER — Inpatient Hospital Stay (HOSPITAL_COMMUNITY): Payer: Medicaid Other

## 2015-04-15 DIAGNOSIS — I739 Peripheral vascular disease, unspecified: Secondary | ICD-10-CM

## 2015-04-15 MED ORDER — CIPROFLOXACIN HCL 500 MG PO TABS: 500.0000 mg | ORAL_TABLET | Freq: Two times a day (BID) | ORAL | Status: AC

## 2015-04-15 MED ORDER — OXYCODONE HCL 5 MG PO TABS: 5.0000 mg | ORAL_TABLET | Freq: Four times a day (QID) | ORAL | Status: DC | PRN

## 2015-04-15 NOTE — Progress Notes (Signed)
Pharmacy Antibiotic Note  Joe Ballard is a 65 y.o. male admitted on 04/08/2015 with left foot osteomyelitis. S/p arteriogram 2/27 which revealed complete occlusion of the superficial femoral artery. He underwent fem pop bypass on 3/1. He continues on day #8 vancomycin and cefepime. Renal function is stable.   Plan: 1) Continue vancomycin 1g IV q8 2) Continue cefepime 1g IV q8  Height: 6' 0.01" (182.9 cm) Weight: 153 lb 7 oz (69.6 kg) IBW/kg (Calculated) : 77.62  Temp (24hrs), Avg:98.4 F (36.9 C), Min:97.7 F (36.5 C), Max:99.1 F (37.3 C)   Recent Labs Lab 04/08/15 1440 04/09/15 0402 04/12/15 0401 04/12/15 0656 04/13/15 0245 04/14/15 0347  WBC 10.9* 10.6*  --   --  8.9 9.6  CREATININE 0.70 0.73 0.77  --  0.89 0.86  VANCOTROUGH  --   --   --  17  --   --     Estimated Creatinine Clearance: 87.7 mL/min (by C-G formula based on Cr of 0.86).    Allergies  Allergen Reactions  . Penicillins Rash    Has patient had a PCN reaction causing immediate rash, facial/tongue/throat swelling, SOB or lightheadedness with hypotension: Yes   . Seroquel [Quetiapine Fumarate] Other (See Comments)    Makes him crazy    Antimicrobials this admission: 2/24 Vancomycin >> 2/24 Cefepime >>   Dose adjustments this admission: 2/28 VT = 17 on 1g q8  Microbiology results: No cultures  Thank you for allowing pharmacy to be a part of this patient's care.  Fredrik RiggerMarkle, Dvaughn Fickle Sue 04/15/2015 10:21 AM
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
## 2015-04-15 NOTE — Progress Notes (Signed)
VASCULAR LAB PRELIMINARY  PRELIMINARY  PRELIMINARY  PRELIMINARY  VASCULAR LAB PRELIMINARY  ARTERIAL  ABI completed:    RIGHT    LEFT    PRESSURE WAVEFORM  PRESSURE WAVEFORM  BRACHIAL 134 triphasic BRACHIAL 129 triphasic  DP 83 monophasic DP 99 monophasic  PT 71 monophasic PT 99 monophasic    RIGHT LEFT  ABI 0.62 0.74   Bilaterally- ABI's are suggestive Moderate arterial disease at rest.   Jenetta Logesami Annasofia Pohl, RVT, RDMS 04/15/2015, 5:32 PM

## 2015-04-15 NOTE — NC FL2 (Signed)
[place] MEDICAID FL2 LEVEL OF CARE SCREENING TOOL     IDENTIFICATION  Patient Name: Joe Ballard Birthdate: 1950/05/11 Sex: male Admission Date (Current Location): 04/08/2015  [facility] and [place] Number:  Producer, television/film/video and Address:  [facility], [address] [place], [place] [address]      Provider Number: [id number]  Attending Physician Name and Address:  Larina Earthly, MD  Relative Name and Phone Number:  Huey Bienenstock, friend, [phone]    Current Level of Care: Hospital Recommended Level of Care: Skilled Nursing Facility Prior Approval Number:    Date Approved/Denied:   PASRR Number:    Discharge Plan: SNF    Current Diagnoses: Patient Active Problem List   Diagnosis Date Noted  . PAD (peripheral artery disease) (HCC) 04/13/2015  . Osteomyelitis (HCC) 04/08/2015  . Osteomyelitis of toe of left foot (HCC) 04/08/2015  . Ischemia of foot 04/08/2015  . Aftercare following surgery of the circulatory system, NEC 02/06/2013  . Painful legs and moving toes 03/24/2012  . PVD (peripheral vascular disease) (HCC) 11/22/2011  . Pain in limb 10/29/2011  . Atherosclerosis of native artery of extremity with intermittent claudication (HCC) 06/18/2011  . Peripheral vascular disease, unspecified (HCC) 04/30/2011  . Gangrene (HCC) 04/16/2011    Orientation RESPIRATION BLADDER Height & Weight     Place, Situation, Time, Self  Normal Continent Weight: 153 lb 7 oz (69.6 kg) Height:  6' 0.01" (182.9 cm)  BEHAVIORAL SYMPTOMS/MOOD NEUROLOGICAL BOWEL NUTRITION STATUS      Continent  (Please see DC summary)  AMBULATORY STATUS COMMUNICATION OF NEEDS Skin   Limited Assist Verbally Surgical wounds                       Personal Care Assistance Level of Assistance  Bathing, Feeding, Dressing Bathing Assistance: Limited assistance Feeding assistance: Independent Dressing Assistance: Independent     Functional Limitations Info              SPECIAL CARE FACTORS FREQUENCY  PT (By licensed PT), OT (By licensed OT)     PT Frequency: min 3x/week OT Frequency: min 2x/week            Contractures      Additional Factors Info  Code Status, Allergies, Psychotropic Code Status Info: Full Allergies Info: Penicillins, Seroquel Psychotropic Info: Xanax         Current Medications (04/15/2015):  This is the current hospital active medication list Current Facility-Administered Medications  Medication Dose Route Frequency Provider Last Rate Last Dose  . acetaminophen (TYLENOL) tablet 325-650 mg  325-650 mg Oral Q4H PRN Ames Coupe Rhyne, [name]   650 mg at 04/14/15 0435   Or  . acetaminophen (TYLENOL) suppository 325-650 mg  325-650 mg Rectal Q4H PRN Samantha J Rhyne, [name]      . albuterol (PROVENTIL) (2.5 MG/3ML) 0.083% nebulizer solution 2.5 mg  2.5 mg Nebulization Q2H PRN Therisa Doyne, MD      . ALPRAZolam Prudy Feeler) tablet 1 mg  1 mg Oral TID Therisa Doyne, MD   1 mg at 04/15/15 1013  . ALPRAZolam (XANAX) tablet 1 mg  1 mg Oral Daily PRN Therisa Doyne, MD      . alum & mag hydroxide-simeth (MAALOX/MYLANTA) 200-200-20 MG/5ML suspension 15-30 mL  15-30 mL Oral Q2H PRN Samantha J Rhyne, [name]      . aspirin EC tablet 325 mg  325 mg Oral Daily Samantha J Rhyne, [name]   325 mg  at 04/15/15 1013  . ceFEPIme (MAXIPIME) 1 g in dextrose 5 % 50 mL IVPB  1 g Intravenous 3 times per day Emi HolesJennifer S Markle, RPH 100 mL/hr at 04/15/15 0822 1 g at 04/15/15 [id number]  . docusate sodium (COLACE) capsule 100 mg  100 mg Oral BID Therisa DoyneAnastassia Doutova, MD   100 mg at 04/15/15 1012  . enoxaparin (LOVENOX) injection 40 mg  40 mg Subcutaneous Q24H Samantha J Rhyne, [name]   40 mg at 04/14/15 1641  . guaiFENesin (MUCINEX) 12 hr tablet 600 mg  600 mg Oral BID Therisa DoyneAnastassia Doutova, MD   600 mg at 04/14/15 1017  . guaiFENesin-dextromethorphan (ROBITUSSIN DM) 100-10 MG/5ML syrup 15 mL  15 mL Oral Q4H PRN Samantha J Rhyne, [name]      . hydrALAZINE  (APRESOLINE) injection 5 mg  5 mg Intravenous Q20 Min PRN Samantha J Rhyne, [name]      . HYDROmorphone (DILAUDID) injection 2-3 mg  2-3 mg Intravenous Q4H PRN Maretta BeesShanker M Ghimire, MD   2 mg at 04/14/15 0345  . labetalol (NORMODYNE,TRANDATE) injection 10 mg  10 mg Intravenous Q10 min PRN Samantha J Rhyne, [name]      . metoprolol (LOPRESSOR) injection 2-5 mg  2-5 mg Intravenous Q2H PRN Samantha J Rhyne, [name]      . nicotine (NICODERM CQ - dosed in mg/24 hours) patch 21 mg  21 mg Transdermal Daily Therisa DoyneAnastassia Doutova, MD   21 mg at 04/15/15 1013  . ondansetron (ZOFRAN) injection 4 mg  4 mg Intravenous Q6H PRN Samantha J Rhyne, [name]      . oxyCODONE (Oxy IR/ROXICODONE) immediate release tablet 20-30 mg  20-30 mg Oral Q6H PRN Maretta BeesShanker M Ghimire, MD   30 mg at 04/15/15 1452  . pantoprazole (PROTONIX) EC tablet 40 mg  40 mg Oral Daily Samantha J Rhyne, [name]   40 mg at 04/15/15 1013  . phenol (CHLORASEPTIC) mouth spray 1 spray  1 spray Mouth/Throat PRN Samantha J Rhyne, [name]      . polyethylene glycol (MIRALAX / GLYCOLAX) packet 17 g  17 g Oral Daily Maretta BeesShanker M Ghimire, MD   17 g at 04/14/15 1022  . potassium chloride SA (K-DUR,KLOR-CON) CR tablet 20-40 mEq  20-40 mEq Oral Daily PRN Samantha J Rhyne, [name]      . senna (SENOKOT) tablet 8.6 mg  1 tablet Oral BID Therisa DoyneAnastassia Doutova, MD   8.6 mg at 04/15/15 1013  . sodium chloride flush (NS) 0.9 % injection 3 mL  3 mL Intravenous Q12H Therisa DoyneAnastassia Doutova, MD   3 mL at 04/14/15 2203  . sodium chloride flush (NS) 0.9 % injection 3 mL  3 mL Intravenous PRN Therisa DoyneAnastassia Doutova, MD   3 mL at 04/12/15 2140  . traZODone (DESYREL) tablet 75 mg  75 mg Oral QHS Therisa DoyneAnastassia Doutova, MD   75 mg at 04/14/15 2200  . vancomycin (VANCOCIN) IVPB 1000 mg/200 mL premix  1,000 mg Intravenous Q8H Emi HolesJennifer S Markle, RPH 200 mL/hr at 04/15/15 0823 1,000 mg at 04/15/15 [id number]  . zolpidem (AMBIEN) tablet 10 mg  10 mg Oral QHS Therisa DoyneAnastassia Doutova, MD   10 mg at 04/14/15 2200     Discharge  Medications: Please see discharge summary for a list of discharge medications.  Relevant Imaging Results:  Relevant Lab Results:   Additional Information SSN: [id number]1081  Nadia S [place] Lake[place], [place]
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
## 2015-04-16 LAB — GLUCOSE, CAPILLARY: GLUCOSE-CAPILLARY: 94 mg/dL (ref 65–99)

## 2015-04-16 MED ORDER — HYDROCORTISONE 1 % EX CREA
TOPICAL_CREAM | Freq: Two times a day (BID) | CUTANEOUS | Status: DC
  Administered 2015-04-16 – 2015-04-17 (×3): via TOPICAL
  Filled 2015-04-16 (×2): qty 28

## 2015-04-16 MED ORDER — DIPHENHYDRAMINE HCL 25 MG PO CAPS: 25.0000 mg | ORAL_CAPSULE | Freq: Four times a day (QID) | ORAL | Status: DC | PRN

## 2015-04-16 NOTE — Progress Notes (Addendum)
[facility] [place]  Subjective  - Doing OK.  He doesn't want to go to a SNF, but he won't walk.   Objective 115/48 85 97.9 F (36.6 C) (Oral) 18 98%  Intake/Output Summary (Last 24 hours) at 04/16/15 0804 Last data filed at 04/15/15 2200  Gross per 24 hour  Intake    240 ml  Output   1600 ml  Net  -1360 ml    Left foot Palpable DP, decreased ruber and decreased edema. 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  -will get social work consult for possible SNF placement, patient wants to go home.   Clinton GallantCOLLINS, EMMA [facility] 04/16/2015 8:04 AM --  Laboratory Lab Results:  Recent Labs  04/14/15 0347  WBC 9.6  HGB 12.7*  HCT 39.5  PLT 329   BMET  Recent Labs  04/14/15 0347  NA 135  K 4.8  CL 97*  CO2 33*  GLUCOSE 83  BUN 7  CREATININE 0.86  CALCIUM 8.7*    COAG Lab Results  Component Value Date   INR 1.05 04/13/2015   INR 1.0 09/07/2005   No results found for: PTT    I agree with the above.  I have seen and evaluated the patient.  His incisions are healing appropriately.  His foot is warm and well perfused.  He is complaining about itching and a rash on his back.  I will start him on Benadryl and cream for his back.  We discussed the possibility of discharge tomorrow he is currently refusing SNF.  He is very concerned about pain medications  Wells Crystal Scarberry

## 2015-04-17 LAB — BASIC METABOLIC PANEL
ANION GAP: 10 (ref 5–15)
BUN: 5 mg/dL — AB (ref 6–20)
CHLORIDE: 98 mmol/L — AB (ref 101–111)
CO2: 28 mmol/L (ref 22–32)
Calcium: 9 mg/dL (ref 8.9–10.3)
Creatinine, Ser: 0.81 mg/dL (ref 0.61–1.24)
GFR calc Af Amer: >60 mL/min (ref >60)
GFR calc non Af Amer: >60 mL/min (ref >60)
Glucose, Bld: 123 mg/dL — ABNORMAL HIGH (ref 65–99)
POTASSIUM: 4 mmol/L (ref 3.5–5.1)
SODIUM: 136 mmol/L (ref 135–145)

## 2015-04-17 MED ORDER — ALPRAZOLAM 0.5 MG PO TABS
1.0000 mg | ORAL_TABLET | Freq: Three times a day (TID) | ORAL | Status: DC
  Administered 2015-04-17 (×2): 1 mg via ORAL
  Filled 2015-04-17 (×2): qty 2

## 2015-04-17 MED ORDER — OXYCODONE HCL 20 MG PO TABS: 20.0000 mg | ORAL_TABLET | Freq: Four times a day (QID) | ORAL | Status: DC | PRN

## 2015-04-17 MED ORDER — OXYCODONE HCL 5 MG PO TABS: 20.0000 mg | ORAL_TABLET | ORAL | Status: DC | PRN
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
## 2015-04-17 NOTE — Progress Notes (Signed)
Orthopedic Tech Progress Note Patient Details:  Joe PatrickRaymond Fatula [birth year] [id number]  Patient ID: Joe Joe Ballard, male   DOB: [birth year], 65 y.o.   MRN: [id number] Viewed order from doctor's order list  Joe Ballard, Joe Ballard 04/17/2015, 11:24 AM

## 2015-04-17 NOTE — Progress Notes (Signed)
Orthopedic Tech Progress Note Patient Details:  Joe PatrickRaymond Ballard 11/16/1950 [id number]  Ortho Devices Type of Ortho Device: Crutches Ortho Device/Splint Interventions: Application   Adeyemi Hamad 04/17/2015, 11:24 AM

## 2015-04-17 NOTE — Progress Notes (Signed)
Order received to discharge.  Telemetry removed and CCMD notified. IV removed with catheter intact.  Discharge education given to Pt.  Post surgical incision care handout printed and given to Pt.  Pt indicates understanding.  Emphasized need for Pt to continue antibiotics at home.  Unclear if Pt will be compliant with teaching.  Pt denies pain at this time.  Pt stable at discharge.

## 2015-04-17 NOTE — Progress Notes (Signed)
Pt transferred to exit via WC to meet significant other.  Pt stable no s/s of distress.

## 2015-04-17 NOTE — Progress Notes (Signed)
    Subjective  - POD #4  Can't goto SNF as he has to go home to pay bills Rash and itching on back are better Wants regular diet   Physical Exam:  Incisions ok Foot warm and perfused       Assessment/Plan:  POD #4  Change to regular diet PT needs to work with him so he can use crutches If he clears PT, he can go home today, otherwise it may be tomorrow  Durene CalBrabham, Wells 04/17/2015 10:55 AM --  Ceasar MonsFiled Vitals:   04/16/15 2041 04/17/15 0620  BP: 123/63 112/62  Pulse: 96 81  Temp: 98.1 F (36.7 C) 99 F (37.2 C)  Resp: 18 18    Intake/Output Summary (Last 24 hours) at 04/17/15 1055 Last data filed at 04/17/15 0458  Gross per 24 hour  Intake    240 ml  Output   1750 ml  Net  -1510 ml     Laboratory CBC    Component Value Date/Time   WBC 9.6 04/14/2015 0347   WBC 7.2 09/07/2005 1048   HGB 12.7* 04/14/2015 0347   HGB 16.1 09/07/2005 1048   HCT 39.5 04/14/2015 0347   HCT 46.1 09/07/2005 1048   PLT 329 04/14/2015 0347   PLT 298 09/07/2005 1048    BMET    Component Value Date/Time   NA 136 04/17/2015 0409   K 4.0 04/17/2015 0409   CL 98* 04/17/2015 0409   CO2 28 04/17/2015 0409   GLUCOSE 123* 04/17/2015 0409   BUN 5* 04/17/2015 0409   CREATININE 0.81 04/17/2015 0409   CALCIUM 9.0 04/17/2015 0409   GFRNONAA >60 04/17/2015 0409   GFRAA >60 04/17/2015 0409    COAG Lab Results  Component Value Date   INR 1.05 04/13/2015   INR 1.0 09/07/2005   No results found for: PTT  Antibiotics Anti-infectives    Start     Dose/Rate Route Frequency Ordered Stop   04/15/15 0000  ciprofloxacin (CIPRO) 500 MG tablet     500 mg Oral 2 times daily 04/15/15 1205     04/13/15 1000  vancomycin (VANCOCIN) IVPB 1000 mg/200 mL premix     1,000 mg 200 mL/hr over 60 Minutes Intravenous To ShortStay Surgical 04/12/15 1852 04/13/15 1230   04/08/15 1600  vancomycin (VANCOCIN) IVPB 1000 mg/200 mL premix     1,000 mg 200 mL/hr over 60 Minutes Intravenous Every 8 hours  04/08/15 1532     04/08/15 1600  ceFEPIme (MAXIPIME) 1 g in dextrose 5 % 50 mL IVPB     1 g 100 mL/hr over 30 Minutes Intravenous 3 times per day 04/08/15 1534     04/08/15 1544  ceFEPIme (MAXIPIME) 1 g injection    Comments:  Earlene Plateravis, Misty   : cabinet override      04/08/15 1544 04/09/15 0344       V. Charlena CrossWells Brabham IV, M.D. [facility] [place] Office: [phone] Pager:  [phone]

## 2015-04-27 ENCOUNTER — Encounter: Payer: Self-pay | Admitting: Vascular Surgery

## 2015-04-28 ENCOUNTER — Telehealth: Payer: Self-pay

## 2015-04-28 DIAGNOSIS — G8918 Other acute postprocedural pain: Secondary | ICD-10-CM

## 2015-04-28 MED ORDER — OXYCODONE HCL 20 MG PO TABS: 20.0000 mg | ORAL_TABLET | Freq: Four times a day (QID) | ORAL | Status: AC | PRN

## 2015-04-28 NOTE — Telephone Encounter (Signed)
Pt. Called to request refill on pain medication.  Stated he rec'd Rx for Oxycodone 20 mg 1 q 6 hr.  Reported has only one tab left.  Then stated he still has the written prescription for Oxycodone 5 mg, IR tab, # 30 that he didn't get filled.  Is requesting to receive Rx for the Oxycodone 20 mg tablets, and stated he "doesn't think the 5 mg. tablets will do any good."  Reported the bottom of left foot is sore, and feels like a "stone bruise."  Stated the left leg incisions are healing and the swelling is improving.  Discussed with Dr. Arbie CookeyEarly.  Gave approval for Oxycodone 20 mg,  # 40, no refills.  Pt. Will be notified to pick up Rx.

## 2015-05-03 ENCOUNTER — Ambulatory Visit (INDEPENDENT_AMBULATORY_CARE_PROVIDER_SITE_OTHER): Payer: Self-pay | Admitting: Vascular Surgery

## 2015-05-03 ENCOUNTER — Encounter: Payer: Self-pay | Admitting: Vascular Surgery

## 2015-05-03 VITALS — BP 130/72 | HR 88 | Temp 97.7°F | Resp 18 | Ht 72.0 in | Wt 158.3 lb

## 2015-05-03 DIAGNOSIS — I70229 Atherosclerosis of native arteries of extremities with rest pain, unspecified extremity: Secondary | ICD-10-CM

## 2015-05-03 DIAGNOSIS — I739 Peripheral vascular disease, unspecified: Secondary | ICD-10-CM
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
## 2015-05-04 NOTE — Addendum Note (Signed)
Addended by: Dannielle KarvonenMANESS-HARRISON, CHANDA C on: 05/04/2015 01:49 PM   Modules accepted: Orders

## 2015-05-05 ENCOUNTER — Other Ambulatory Visit: Payer: Self-pay

## 2015-05-05 DIAGNOSIS — I739 Peripheral vascular disease, unspecified: Secondary | ICD-10-CM

## 2015-05-18 ENCOUNTER — Encounter: Payer: Self-pay | Admitting: Vascular Surgery

## 2015-05-18 NOTE — Progress Notes (Signed)
Patient ID: Joe Ballard, male   DOB: [birth year], 65 y.o.   MRN: [id number]   Patient will tender office today requesting to have a recent Percocet prescription redone. He presented his old prescription which did not have the sig directions on the prescription.  I reviewed Dr. Bosie Helperearly's previous office note which correlated with a prescription. The patient was given a renewal for his Percocet 5/325 #30 dispensed. The patient was requesting a higher dose of narcotic but per Dr. Bosie HelperEarly's note this should be his last prescription and since this was written over 2 weeks ago I do not believe the patient should need escalating narcotics at this point. The patient wished to schedule an earlier appointment with Dr. Arbie CookeyEarly to further discuss this.  Fabienne Brunsharles Neel Buffone, MD [facility] [place] Office: [phone] Pager: [phone]

## 2015-05-25 ENCOUNTER — Telehealth: Payer: Self-pay

## 2015-05-25 DIAGNOSIS — M79605 Pain in left leg: Secondary | ICD-10-CM

## 2015-05-25 MED ORDER — GABAPENTIN 100 MG PO CAPS
100.0000 mg | ORAL_CAPSULE | Freq: Three times a day (TID) | ORAL | Status: AC
Start: 2015-05-25 — End: ?

## 2015-05-25 NOTE — Telephone Encounter (Signed)
Pt. Called and requested a refill on pain medication.  Stated the pain feels like nerve pain.  Reported frequent shooting pain in the toes and bottom of foot.  Also reported his foot feels numb.  Is requesting medication prior to seeing Dr. Arbie CookeyEarly on 06/07/15.  Stated "I only have one pill left, and I can't make it until the 25th, before I get something for pain."  Discussed with Dr. Edilia Boickson.  Recommended to start Gabapentin 100 mg po, tid; # 90, RF x 3.  Phone call to pt. and informed of the recommendation to start new medication.  Advised to call back to office if he has any questions.

## 2015-05-30 ENCOUNTER — Telehealth: Payer: Self-pay

## 2015-05-30 DIAGNOSIS — G8918 Other acute postprocedural pain: Secondary | ICD-10-CM

## 2015-05-30 NOTE — Telephone Encounter (Signed)
Pt. Called office stating the Neurontin that was ordered last week, he is unable to take, due to drug interaction with other medications he is taking.  Reported constant pain in the left foot, and intermitent pain in left leg.  Reported the pain occurs with both activity and at rest, "but mostly activity."  Denied any incisional pain; reported the incisions are healing.  Reported the sore of the 3rd toe and on side of (L) foot is healing; reported scabs over both areas.  Denied any fever/ chills.   Questioned why he can't get something for pain after he has been operated on.  Advised that since he is 6 weeks post-op, and we can't continue to prescribe narcotic pain medication this far past his surgery date.  Advised that he may need referral to a pain management clinic.  The pt. Stated he used to be in pain management program with "[facility] in [place]."  Advised pt. Will need to discuss with Dr. Arbie CookeyEarly.  Agreed.

## 2015-05-31 MED ORDER — TRAMADOL HCL 50 MG PO TABS: 50.0000 mg | ORAL_TABLET | Freq: Four times a day (QID) | ORAL | Status: AC | PRN

## 2015-05-31 NOTE — Telephone Encounter (Signed)
Discussed with Dr. Arbie CookeyEarly of alert with ordering Tramadol, in pt. that has drug allergy to Seroquel.  Per Dr. Arbie CookeyEarly, instruct the pt. that there is a small chance he may have an allergic reaction to Tramadol.  Notified pt. of Dr. Bosie HelperEarly's recommendation.  Pt. stated he had "minimal reaction to Seroquel."

## 2015-05-31 NOTE — Telephone Encounter (Signed)
Discussed pt's request for pain medication.  Recommended Tramadol 50 mg 1 tab q 6 hrs., prn.; # 30; no refills.  Phone call to pt.  Notified of pain medication ordered by Dr. Arbie CookeyEarly.  Advised pt. He will need to use the pain medication with discretion, and make it last until next appt. on 06/07/15.  Verb. Understanding.

## 2015-06-01 ENCOUNTER — Encounter: Payer: Self-pay | Admitting: Vascular Surgery

## 2015-06-07 ENCOUNTER — Ambulatory Visit (INDEPENDENT_AMBULATORY_CARE_PROVIDER_SITE_OTHER): Payer: Self-pay | Admitting: Vascular Surgery

## 2015-06-07 ENCOUNTER — Encounter: Payer: Self-pay | Admitting: Vascular Surgery

## 2015-06-07 VITALS — BP 142/83 | HR 76 | Temp 97.4°F | Resp 18 | Ht 72.0 in | Wt 155.7 lb

## 2015-06-07 DIAGNOSIS — G8918 Other acute postprocedural pain: Secondary | ICD-10-CM

## 2015-06-07 DIAGNOSIS — I70229 Atherosclerosis of native arteries of extremities with rest pain, unspecified extremity: Secondary | ICD-10-CM

## 2015-06-07 DIAGNOSIS — I739 Peripheral vascular disease, unspecified: Secondary | ICD-10-CM

## 2015-06-07 MED ORDER — OXYCODONE-ACETAMINOPHEN 5-325 MG PO TABS: 1.0000 | ORAL_TABLET | Freq: Four times a day (QID) | ORAL | Status: AC | PRN

## 2015-06-07 NOTE — Addendum Note (Signed)
Addended by: Sharee PimpleMCCHESNEY, MARILYN K on: 06/07/2015 01:51 PM   Modules accepted: Orders

## 2015-06-07 NOTE — Progress Notes (Signed)
Patient name: Joe PatrickRaymond Ballard MRN: [id number] DOB: Dec 14, 1950 Sex: male  REASON FOR VISIT: Here today for continued follow-up of left femoral-popliteal bypass using vein from his right leg. He had critical limb ischemia with extensive tissue loss over his entire forefoot on the left. He had prior SFA angioplasty with Dr. Myra GianottiBrabham and had a prior [facility]. 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 Has multiple complaints. He reports that he is having pain and burning sensation on the lower portion of his foot. He has no discomfort related to the surgical incisions and is not having any claudication type symptoms. He is requesting narcotic pain meds stating that his tramadol did not help.    Current Outpatient Prescriptions  Medication Sig Dispense Refill  . albuterol (PROVENTIL HFA;VENTOLIN HFA) 108 (90 BASE) MCG/ACT inhaler Inhale 1 puff into the lungs 2 (two) times daily. For shortness of breath    . ALPRAZolam (XANAX) 1 MG tablet Take 1 mg by mouth See admin instructions. Take 1 tablet (1 mg) by mouth 3 times daily, may take an additional dose if needed for anxiety    . ibuprofen (ADVIL,MOTRIN) 200 MG tablet Take 400 mg by mouth every 6 (six) hours as needed (pain).     . traMADol (ULTRAM) 50 MG tablet Take 1 tablet (50 mg total) by mouth every 6 (six) hours as needed. 30 tablet 0  . traZODone (DESYREL) 150 MG tablet Take 75 mg by mouth at bedtime.     Marland Kitchen. zolpidem (AMBIEN) 10 MG tablet Take 20 mg by mouth at bedtime.     . ciprofloxacin (CIPRO) 500 MG tablet Take 1 tablet (500 mg total) by mouth 2 (two) times daily. (Patient not taking: Reported on 06/07/2015) 84 tablet 0  . gabapentin (NEURONTIN) 100 MG capsule Take 1 capsule (100 mg total) by mouth 3 (three) times daily. (Patient not taking: Reported on 06/07/2015) 90 capsule 3  . Oxycodone HCl 20 MG TABS Take 1 tablet (20 mg total) by mouth every 6 (six) hours as needed. (Patient not taking: Reported on 06/07/2015) 40 tablet 0     No current facility-administered medications for this visit.     PHYSICAL EXAM: Filed Vitals:   06/07/15 1213 06/07/15 1217  BP:  142/83  Pulse:  76  Temp:  97.4 F (36.3 C)  TempSrc:  Oral  Resp:  18  Height: 6' (1.829 m) 6' (1.829 m)  Weight: 155 lb 11.2 oz (70.625 kg) 155 lb 11.2 oz (70.625 kg)  SpO2:  96%    GENERAL: The patient is a well-nourished male, in no acute distress. The vital signs are documented above. Vein harvest and left femoropopliteal incisions are healing. He had does have a 2+ dorsalis pedis pulse and a 2+ popliteal graft pulse. Does have a small area on the arch of his foot approximately half centimeter by 1 cm with an eschar which is healing and also the tip of one of his left fourth toes.  MEDICAL ISSUES: Excellent Varnell Donate result regarding limb salvage with left femoropopliteal bypass. Was given a prescription for Percocet 5/325 #40 no refills. He is encouraged to get back into pain management for his chronic pain. He has been in this in the past. We will refer him. He is [facility] [place] .  We will see him again in 3 months with repeat noninvasive vascular lab studies  Hiran Leard [facility] [place] Beeper: [phone]

## 2015-06-07 NOTE — Progress Notes (Signed)
Per Dr. Arbie CookeyEarly, I have put a referral to [facility] in [place].  I also put the order for Percocet 5-325 #40 1-2 q 6 hrs prn pain. Per Dr. Arbie CookeyEarly, he told the patient that this is the last narcotic prescription the patient is to get from us. Patient voiced understanding and agreement with going to Pain Management.

## 2015-06-22 ENCOUNTER — Telehealth: Payer: Self-pay | Admitting: Vascular Surgery

## 2015-06-22 NOTE — Telephone Encounter (Signed)
Spoke with [facility]. Pt scheduled for 06/24/15 11am. Pt aware.

## 2015-08-03 DIAGNOSIS — Z0279 Encounter for issue of other medical certificate: Secondary | ICD-10-CM | POA: Diagnosis not present

## 2015-08-25 ENCOUNTER — Encounter: Payer: Self-pay | Admitting: Vascular Surgery

## 2015-08-30 ENCOUNTER — Encounter (HOSPITAL_COMMUNITY): Payer: Medicaid Other

## 2015-08-30 ENCOUNTER — Ambulatory Visit: Payer: Medicaid Other | Admitting: Vascular Surgery

## 2016-02-28 ENCOUNTER — Emergency Department (HOSPITAL_BASED_OUTPATIENT_CLINIC_OR_DEPARTMENT_OTHER)
Admission: EM | Admit: 2016-02-28 | Discharge: 2016-02-28 | Disposition: A | Discharge status: Home / Self Care | Payer: Medicaid Other | Attending: Emergency Medicine | Admitting: Emergency Medicine

## 2016-02-28 ENCOUNTER — Encounter (HOSPITAL_BASED_OUTPATIENT_CLINIC_OR_DEPARTMENT_OTHER): Payer: Self-pay | Admitting: *Deleted

## 2016-02-28 DIAGNOSIS — J449 Chronic obstructive pulmonary disease, unspecified: Secondary | ICD-10-CM | POA: Diagnosis not present

## 2016-02-28 DIAGNOSIS — G8929 Other chronic pain: Secondary | ICD-10-CM

## 2016-02-28 DIAGNOSIS — M79605 Pain in left leg: Secondary | ICD-10-CM | POA: Diagnosis present

## 2016-02-28 DIAGNOSIS — M545 Low back pain: Secondary | ICD-10-CM | POA: Diagnosis not present

## 2016-02-28 DIAGNOSIS — Z791 Long term (current) use of non-steroidal anti-inflammatories (NSAID): Secondary | ICD-10-CM | POA: Diagnosis not present

## 2016-02-28 DIAGNOSIS — I509 Heart failure, unspecified: Secondary | ICD-10-CM | POA: Insufficient documentation

## 2016-02-28 DIAGNOSIS — F17210 Nicotine dependence, cigarettes, uncomplicated: Secondary | ICD-10-CM | POA: Diagnosis not present

## 2016-02-28 DIAGNOSIS — Z79899 Other long term (current) drug therapy: Secondary | ICD-10-CM | POA: Diagnosis not present

## 2016-02-28 HISTORY — DX: Malingerer (conscious simulation): Z76.5

## 2016-02-28 MED ORDER — ACETAMINOPHEN 500 MG PO TABS
1000.0000 mg | ORAL_TABLET | Freq: Once | ORAL | Status: AC
  Administered 2016-02-28: 1000 mg via ORAL
  Filled 2016-02-28: qty 2
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
## 2016-02-28 NOTE — ED Notes (Signed)
ED Provider at bedside. 

## 2016-02-28 NOTE — ED Triage Notes (Addendum)
Lower back pain with pain in his left leg for over a year. States he is tired of the pain. States he was going to the pain clinic but he could not get along with them per pt.

## 2017-03-11 IMAGING — CR DG FOOT COMPLETE 3+V*L*
3 series · 3 of 3 positions shown · non-contrast
Comparison: 02/23/2011

CLINICAL DATA: Left foot swelling and redness. No known injury.
Duration of symptoms 3 weeks.

EXAM:
LEFT FOOT - COMPLETE 3+ VIEW

[t foot ap left]
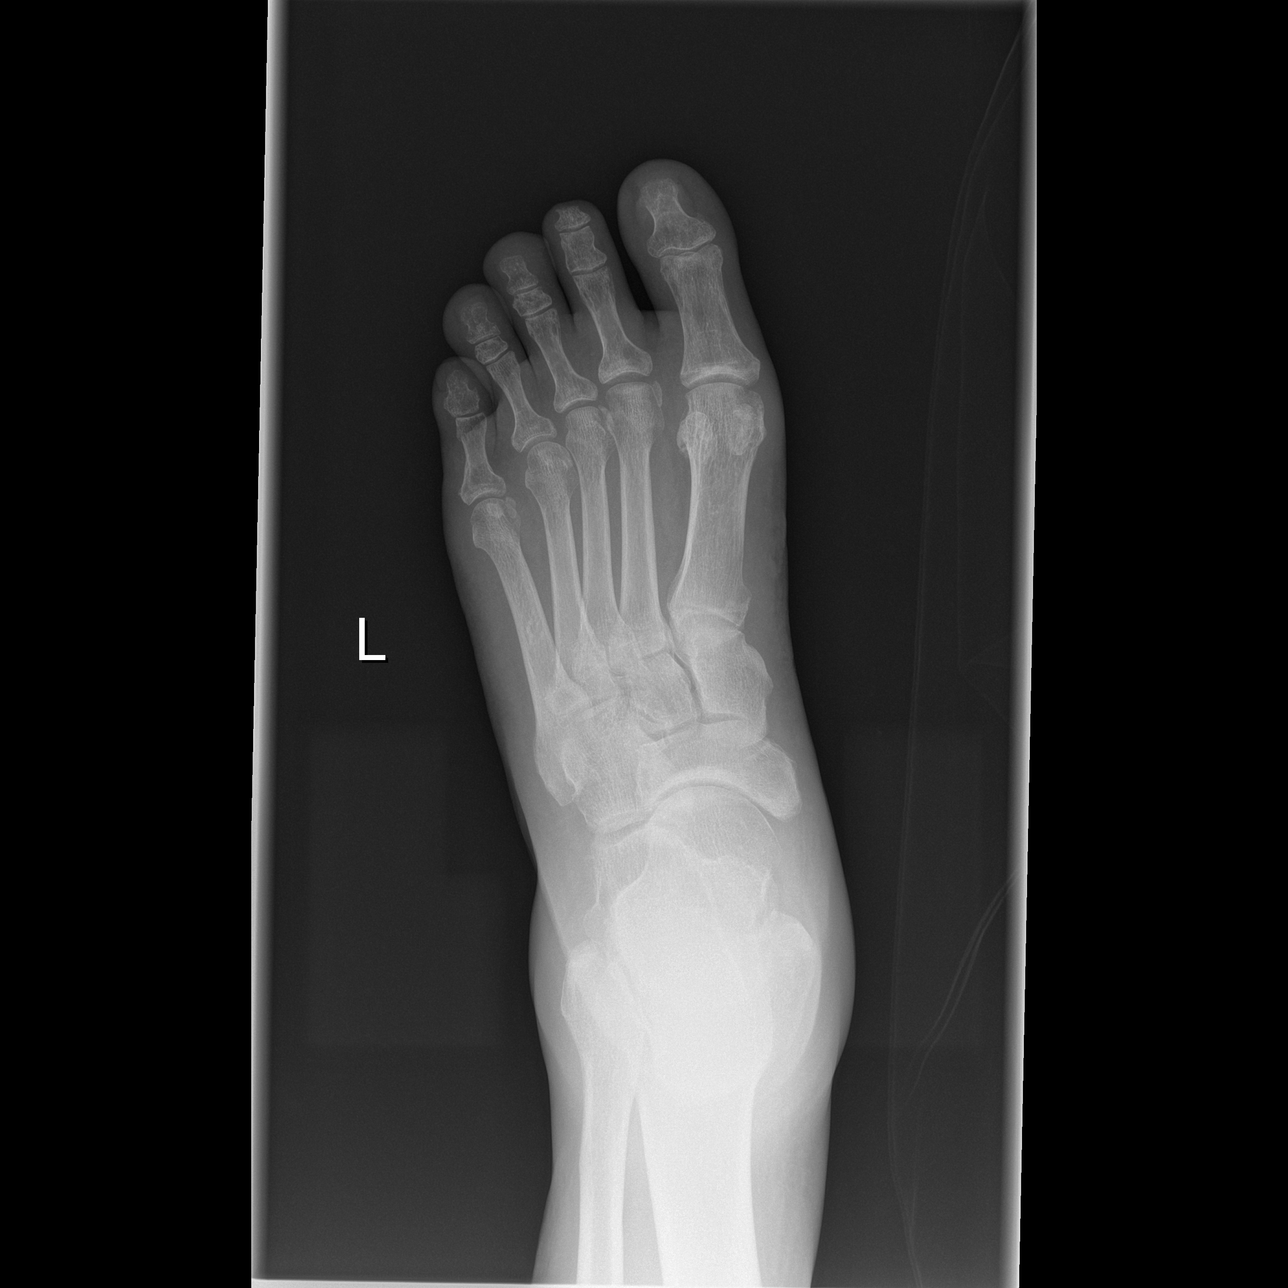

[t foot oblique left]
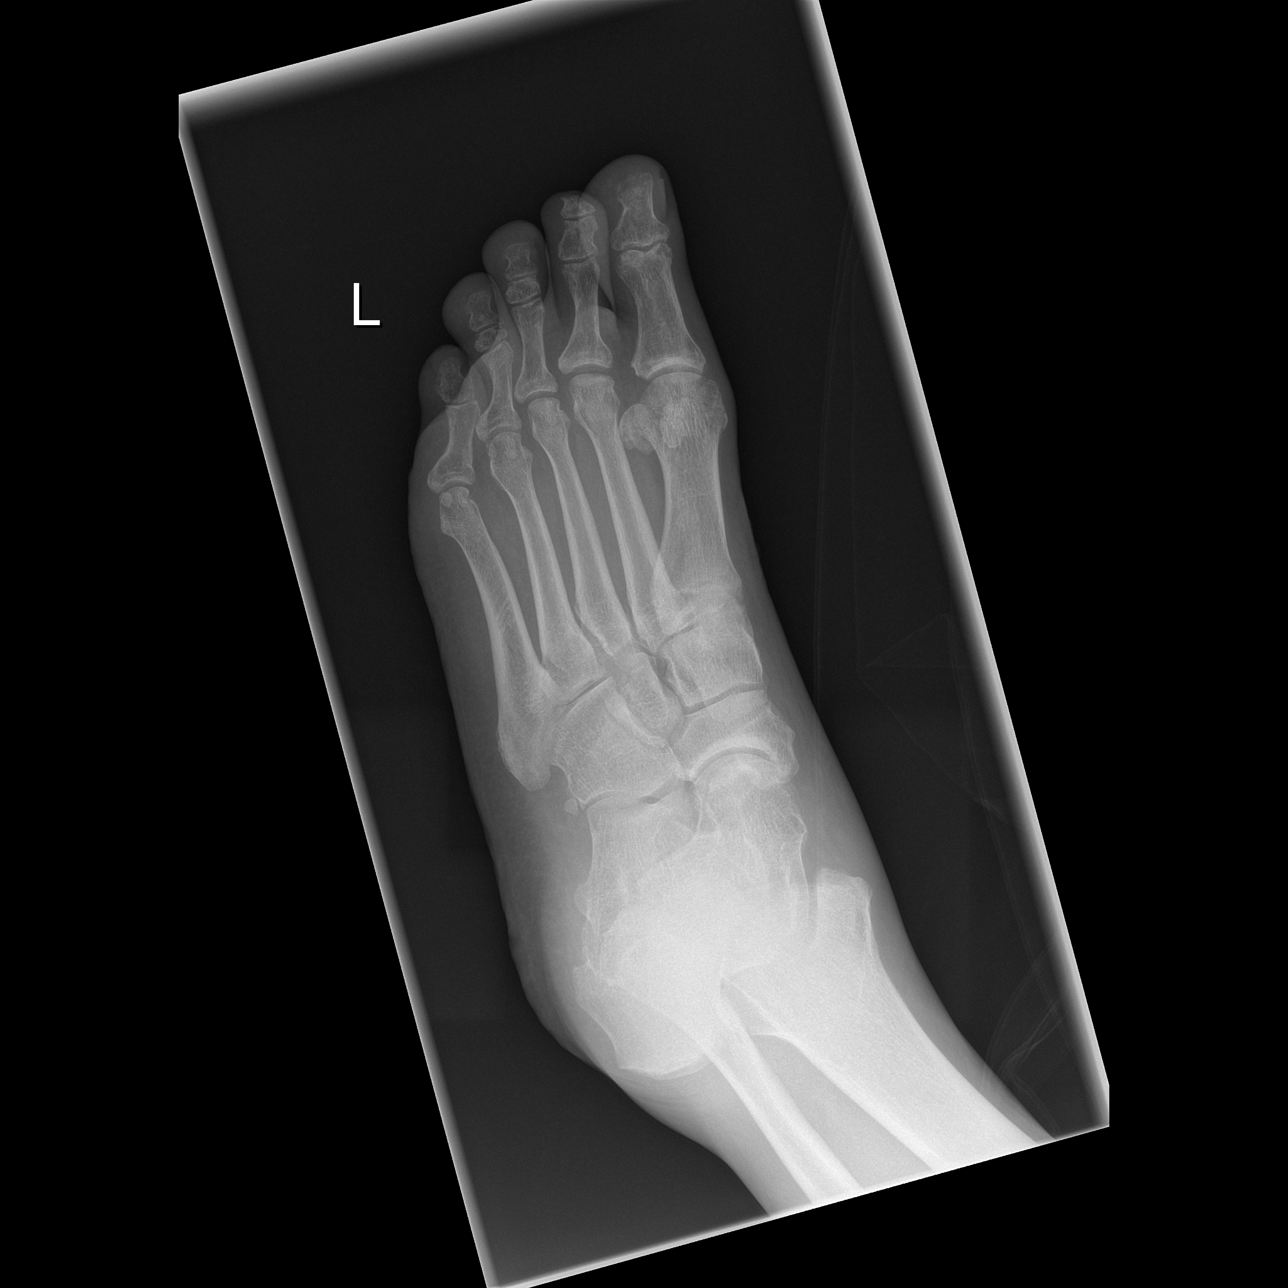

[t foot lat left]
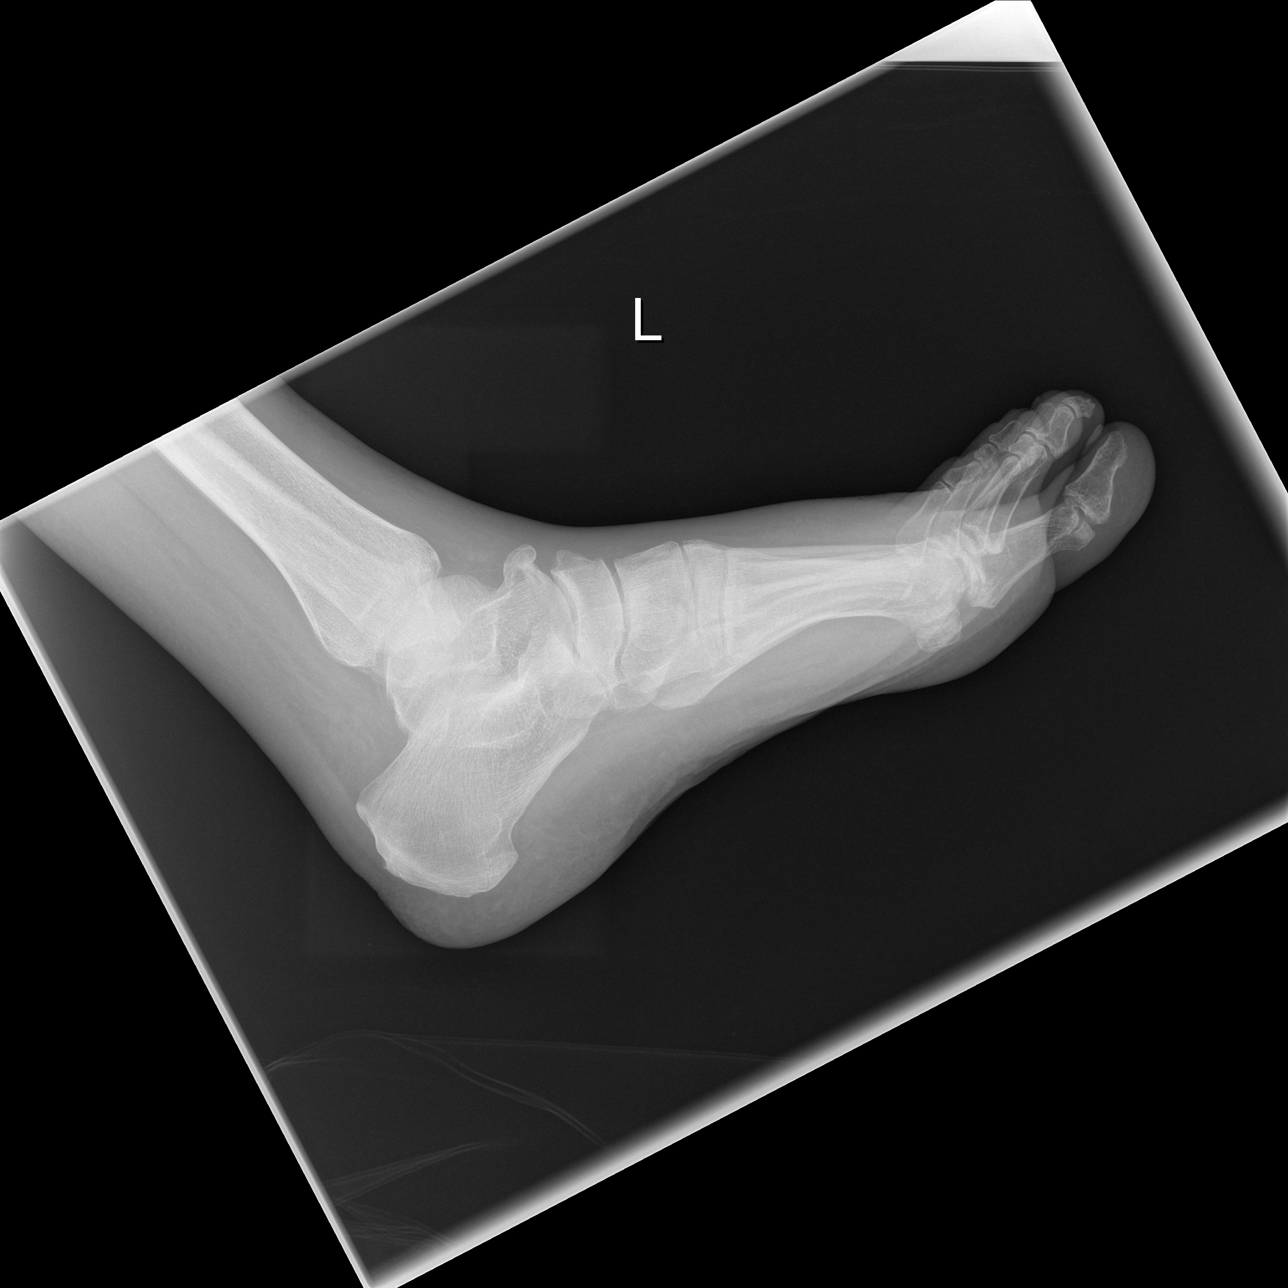

[3 of 3 positions shown; findings below may reference images not displayed]

FINDINGS: There is soft tissue deformity of the distal aspect of the second
toe with erosion of the distal portion of the distal phalanx
consistent with osteomyelitis. The other regional bones are
osteopenic but there is no other sign of focal bone infection.
IMPRESSION: Abnormal soft tissue appearance of the second toe with erosion of
the distal portion of the distal phalanx consistent with
osteomyelitis.

## 2020-03-15 DEATH — deceased
